# Patient Record
Sex: Female | Born: 1990 | Race: Black or African American | Hispanic: No | Marital: Single | Smoking: Never smoker
Health system: Southern US, Community
[De-identification: ages and names within clinical notes are randomized; demographics above are authoritative.]

## PROBLEM LIST (undated history)

## (undated) ENCOUNTER — Inpatient Hospital Stay: Payer: Self-pay

## (undated) DIAGNOSIS — A6000 Herpesviral infection of urogenital system, unspecified: Secondary | ICD-10-CM

## (undated) DIAGNOSIS — O99119 Other diseases of the blood and blood-forming organs and certain disorders involving the immune mechanism complicating pregnancy, unspecified trimester: Secondary | ICD-10-CM

## (undated) DIAGNOSIS — D649 Anemia, unspecified: Secondary | ICD-10-CM

## (undated) DIAGNOSIS — F32A Depression, unspecified: Secondary | ICD-10-CM

## (undated) DIAGNOSIS — D696 Thrombocytopenia, unspecified: Secondary | ICD-10-CM

## (undated) DIAGNOSIS — F329 Major depressive disorder, single episode, unspecified: Secondary | ICD-10-CM

## (undated) HISTORY — PX: APPENDECTOMY: SHX54

## (undated) HISTORY — DX: Herpesviral infection of urogenital system, unspecified: A60.00

---

## 2014-01-14 ENCOUNTER — Emergency Department: Payer: Self-pay | Admitting: Emergency Medicine

## 2014-01-14 LAB — URINALYSIS, COMPLETE
Bacteria: NONE SEEN
Bilirubin,UR: NEGATIVE
Blood: NEGATIVE
Glucose,UR: NEGATIVE mg/dL (ref 0–75)
Ketone: NEGATIVE
Nitrite: POSITIVE
PH: 7 (ref 4.5–8.0)
PROTEIN: NEGATIVE
Specific Gravity: 1.012 (ref 1.003–1.030)
Squamous Epithelial: 1
WBC UR: 18 /HPF (ref 0–5)

## 2014-01-14 LAB — COMPREHENSIVE METABOLIC PANEL
ALK PHOS: 79 U/L
ANION GAP: 9 (ref 7–16)
Albumin: 3.8 g/dL (ref 3.4–5.0)
BUN: 4 mg/dL — AB (ref 7–18)
Bilirubin,Total: 0.8 mg/dL (ref 0.2–1.0)
CALCIUM: 9 mg/dL (ref 8.5–10.1)
Chloride: 105 mmol/L (ref 98–107)
Co2: 23 mmol/L (ref 21–32)
Creatinine: 0.63 mg/dL (ref 0.60–1.30)
EGFR (African American): >60
GLUCOSE: 81 mg/dL (ref 65–99)
OSMOLALITY: 270 (ref 275–301)
Potassium: 3.4 mmol/L — ABNORMAL LOW (ref 3.5–5.1)
SGOT(AST): 25 U/L (ref 15–37)
SGPT (ALT): 38 U/L
SODIUM: 137 mmol/L (ref 136–145)
TOTAL PROTEIN: 7.8 g/dL (ref 6.4–8.2)

## 2014-01-14 LAB — CBC WITH DIFFERENTIAL/PLATELET
BASOS ABS: 0 10*3/uL (ref 0.0–0.1)
Basophil %: 0.6 %
EOS PCT: 0.4 %
Eosinophil #: 0 10*3/uL (ref 0.0–0.7)
HCT: 40 % (ref 35.0–47.0)
HGB: 13.7 g/dL (ref 12.0–16.0)
Lymphocyte #: 1.7 10*3/uL (ref 1.0–3.6)
Lymphocyte %: 27.5 %
MCH: 30.3 pg (ref 26.0–34.0)
MCHC: 34.3 g/dL (ref 32.0–36.0)
MCV: 88 fL (ref 80–100)
MONO ABS: 0.5 x10 3/mm (ref 0.2–0.9)
MONOS PCT: 7.8 %
Neutrophil #: 4 10*3/uL (ref 1.4–6.5)
Neutrophil %: 63.7 %
PLATELETS: 140 10*3/uL — AB (ref 150–440)
RBC: 4.53 10*6/uL (ref 3.80–5.20)
RDW: 12.3 % (ref 11.5–14.5)
WBC: 6.3 10*3/uL (ref 3.6–11.0)

## 2014-01-14 LAB — HCG, QUANTITATIVE, PREGNANCY: Beta Hcg, Quant.: 73441 m[IU]/mL — ABNORMAL HIGH

## 2014-03-07 LAB — OB RESULTS CONSOLE HIV ANTIBODY (ROUTINE TESTING)
HIV: NONREACTIVE
HIV: NONREACTIVE

## 2014-03-07 LAB — OB RESULTS CONSOLE RUBELLA ANTIBODY, IGM: Rubella: IMMUNE

## 2014-03-07 LAB — OB RESULTS CONSOLE GC/CHLAMYDIA
Chlamydia: POSITIVE
Chlamydia: POSITIVE
Gonorrhea: POSITIVE
Gonorrhea: POSITIVE

## 2014-03-07 LAB — OB RESULTS CONSOLE ABO/RH: RH Type: POSITIVE

## 2014-03-07 LAB — OB RESULTS CONSOLE HEPATITIS B SURFACE ANTIGEN: HEP B S AG: NEGATIVE

## 2014-03-07 LAB — OB RESULTS CONSOLE RPR: RPR: NONREACTIVE

## 2014-03-07 LAB — OB RESULTS CONSOLE VARICELLA ZOSTER ANTIBODY, IGG: Varicella: IMMUNE

## 2014-03-09 ENCOUNTER — Emergency Department: Payer: Self-pay | Admitting: Emergency Medicine

## 2014-05-02 LAB — OB RESULTS CONSOLE GC/CHLAMYDIA
Chlamydia: POSITIVE
Gonorrhea: NEGATIVE

## 2014-05-19 ENCOUNTER — Encounter: Payer: Self-pay | Admitting: Advanced Practice Midwife

## 2014-05-19 ENCOUNTER — Inpatient Hospital Stay
Admission: EM | Admit: 2014-05-19 | Discharge: 2014-05-20 | Disposition: A | Discharge status: Home / Self Care | Payer: Medicaid Other | Attending: Family Medicine | Admitting: Family Medicine

## 2014-05-19 DIAGNOSIS — R109 Unspecified abdominal pain: Secondary | ICD-10-CM

## 2014-05-19 DIAGNOSIS — R102 Pelvic and perineal pain: Secondary | ICD-10-CM | POA: Diagnosis present

## 2014-05-19 DIAGNOSIS — O26893 Other specified pregnancy related conditions, third trimester: Secondary | ICD-10-CM | POA: Diagnosis not present

## 2014-05-19 DIAGNOSIS — Z3A29 29 weeks gestation of pregnancy: Secondary | ICD-10-CM | POA: Insufficient documentation

## 2014-05-19 DIAGNOSIS — O09522 Supervision of elderly multigravida, second trimester: Secondary | ICD-10-CM

## 2014-05-19 NOTE — OB Triage Note (Signed)
Pt arrived via wheelchair from ED. Assigned to room Obs 4

## 2014-05-20 ENCOUNTER — Encounter: Payer: Self-pay | Admitting: Advanced Practice Midwife

## 2014-05-20 DIAGNOSIS — O09522 Supervision of elderly multigravida, second trimester: Secondary | ICD-10-CM

## 2014-05-20 DIAGNOSIS — O26893 Other specified pregnancy related conditions, third trimester: Secondary | ICD-10-CM | POA: Diagnosis not present

## 2014-05-20 DIAGNOSIS — R109 Unspecified abdominal pain: Secondary | ICD-10-CM

## 2014-05-20 LAB — URINALYSIS COMPLETE WITH MICROSCOPIC (ARMC ONLY)
Bacteria, UA: NONE SEEN
Bilirubin Urine: NEGATIVE
Glucose, UA: NEGATIVE mg/dL
HGB URINE DIPSTICK: NEGATIVE
Ketones, ur: NEGATIVE mg/dL
Leukocytes, UA: NEGATIVE
NITRITE: NEGATIVE
Protein, ur: NEGATIVE mg/dL
Specific Gravity, Urine: 1.016 (ref 1.005–1.030)
pH: 7 (ref 5.0–8.0)

## 2014-05-20 NOTE — H&P (Signed)
Obstetric H&P   Chief Complaint: Pelvic pressure  Prenatal Care Provider: [facility]  History of Present Illness: 24 y.o. G2P1001 at 1530w0d by 08/05/2014, by Last Menstrual Period presenting to L&D with pelvic pressure.  Has been present for the past month, wa  Review of Systems: 10 point review of systems negative unless otherwise noted in HPI  Past Medical History: HSV  Past Surgical History: Past Surgical History  Procedure Laterality Date  . Appendectomy      Pt reports appendectomy in 2006    Past Obstetric History:  Past Gynecologic History:  Family History: History reviewed. No pertinent family history.  Social History: History   Social History  . Marital Status: Single    Spouse Name: N/A  . Number of Children: N/A  . Years of Education: N/A   Occupational History  . Not on file.   Social History Main Topics  . Smoking status: Never Smoker   . Smokeless tobacco: Not on file  . Alcohol Use: No  . Drug Use: No  . Sexual Activity: Yes   Other Topics Concern  . Not on file   Social History Narrative    Medications: Prior to Admission medications   Not on File    Allergies: No Known Allergies  Physical Exam: Vitals: Blood pressure 109/58, pulse 89, temperature 98.6 F (37 C), temperature source Oral, resp. rate 18, height 5\' 5"  (1.651 m), weight 183 lb (83.008 kg), last menstrual period 10/29/2013.  Urine Dip Protein: n/a  FHT: 150, mod, + accels, no decels Toco:absent  General: NAD HEENT: normocephalic, anicteric Pulmonary: no increased work of breathing   Abdomen: Gravid,  Non-tender Genitourinary: deferred     Extremities: no edema  Labs: Results for orders placed or performed during the hospital encounter of 05/19/14 (from the past 24 hour(s))  Urinalysis complete, with microscopic [facility])     Status: Abnormal   Collection Time: 05/19/14 11:22 PM  Result Value Ref Range   Color, Urine YELLOW (A) YELLOW   APPearance CLEAR (A)  CLEAR   Glucose, UA NEGATIVE NEGATIVE mg/dL   Bilirubin Urine NEGATIVE NEGATIVE   Ketones, ur NEGATIVE NEGATIVE mg/dL   Specific Gravity, Urine 1.016 1.005 - 1.030   Hgb urine dipstick NEGATIVE NEGATIVE   pH 7.0 5.0 - 8.0   Protein, ur NEGATIVE NEGATIVE mg/dL   Nitrite NEGATIVE NEGATIVE   Leukocytes, UA NEGATIVE NEGATIVE   RBC / HPF 0-5 0 - 5 RBC/hpf   WBC, UA 0-5 0 - 5 WBC/hpf   Bacteria, UA NONE SEEN NONE SEEN   Squamous Epithelial / LPF 0-5 (A) NONE SEEN    Assessment: 24 y.o. G3P0000 4130w0d by 08/05/2014, by Last Menstrual Period presenting with discomforts of pregnancy  Plan: 1) No evidence of UTI contractions - discussed discomforts of pregnancy, pregnancy support belts.  2) Fetus - category I tracing  5) Disposition - discharge home follow up on 5/17

## 2014-07-23 ENCOUNTER — Observation Stay
Admission: EM | Admit: 2014-07-23 | Discharge: 2014-07-23 | Disposition: A | Discharge status: Home / Self Care | Payer: Medicaid Other | Attending: Certified Nurse Midwife | Admitting: Certified Nurse Midwife

## 2014-07-23 DIAGNOSIS — O26893 Other specified pregnancy related conditions, third trimester: Secondary | ICD-10-CM | POA: Diagnosis not present

## 2014-07-23 DIAGNOSIS — Z3A38 38 weeks gestation of pregnancy: Secondary | ICD-10-CM | POA: Insufficient documentation

## 2014-07-23 DIAGNOSIS — R109 Unspecified abdominal pain: Secondary | ICD-10-CM | POA: Insufficient documentation

## 2014-07-23 DIAGNOSIS — Z3490 Encounter for supervision of normal pregnancy, unspecified, unspecified trimester: Secondary | ICD-10-CM

## 2014-07-23 DIAGNOSIS — O26899 Other specified pregnancy related conditions, unspecified trimester: Secondary | ICD-10-CM

## 2014-07-23 LAB — URINALYSIS COMPLETE WITH MICROSCOPIC (ARMC ONLY)
Bilirubin Urine: NEGATIVE
Glucose, UA: NEGATIVE mg/dL
Hgb urine dipstick: NEGATIVE
KETONES UR: NEGATIVE mg/dL
Nitrite: NEGATIVE
PH: 7 (ref 5.0–8.0)
PROTEIN: NEGATIVE mg/dL
Specific Gravity, Urine: 1.013 (ref 1.005–1.030)

## 2014-07-23 LAB — OB RESULTS CONSOLE GBS: STREP GROUP B AG: NEGATIVE

## 2014-07-23 NOTE — OB Triage Note (Signed)
Pressure in lower abd since this am.  Denies ROM or VB.  + FM

## 2014-07-23 NOTE — Progress Notes (Signed)
Triage Note in L&D  24 year old G2 P1001 with EDC=08/06/2014 by LMP +20 week scan presented to L&D at 38 weeks with c/o lower abdominal pain x 2 days.  Has noticed this more today when sitting at a ball game. The pain comes and goes and feels better when she bends her knees.   [facility] at 18 weeks, a positive Chlamydia culture x2 and positive GC culture x 1 (TOC 5/25 was negative), a history of genital herpes, and thrombocytopenia (last plt count=113K).   Current Medications: Gummy PNV, Valtrex 1 Gm daily  Past OB Hx: SVD 2012 6#14oz female.  Past Surgical Hx: Appendectomy  Allergies: mushrooms, cherries  Exam: 97/54 Ht 5\' 5"  (1.651 m)  Wt 184 lb (83.462 kg)  BMI 30.62 kg/m2  LMP 10/29/2013 (LMP Unknown)   General: in NAD, alert, oriented Abdomen: tenderness over lower uterine segment, Cephalic presentation FHT: 135-140 with accels to 160s to 180s, mod variability Toco: occasional contraction Cervix: FT/30-40%/-2 Recent Results (from the past 2160 hour(s))  Urinalysis complete, with microscopic [facility])     Status: Abnormal   Collection Time: 05/19/14 11:22 PM  Result Value Ref Range   Color, Urine YELLOW (A) YELLOW   APPearance CLEAR (A) CLEAR   Glucose, UA NEGATIVE NEGATIVE mg/dL   Bilirubin Urine NEGATIVE NEGATIVE   Ketones, ur NEGATIVE NEGATIVE mg/dL   Specific Gravity, Urine 1.016 1.005 - 1.030   Hgb urine dipstick NEGATIVE NEGATIVE   pH 7.0 5.0 - 8.0   Protein, ur NEGATIVE NEGATIVE mg/dL   Nitrite NEGATIVE NEGATIVE   Leukocytes, UA NEGATIVE NEGATIVE   RBC / HPF 0-5 0 - 5 RBC/hpf   WBC, UA 0-5 0 - 5 WBC/hpf   Bacteria, UA NONE SEEN NONE SEEN   Squamous Epithelial / LPF 0-5 (A) NONE SEEN  Urinalysis complete, with microscopic ([facility] only)     Status: Abnormal   Collection Time: 07/23/14 12:50 AM  Result Value Ref Range   Color, Urine AMBER (A) YELLOW   APPearance CLEAR (A) CLEAR   Glucose, UA NEGATIVE NEGATIVE mg/dL   Bilirubin Urine  NEGATIVE NEGATIVE   Ketones, ur NEGATIVE NEGATIVE mg/dL   Specific Gravity, Urine 1.013 1.005 - 1.030   Hgb urine dipstick NEGATIVE NEGATIVE   pH 7.0 5.0 - 8.0   Protein, ur NEGATIVE NEGATIVE mg/dL   Nitrite NEGATIVE NEGATIVE   Leukocytes, UA TRACE (A) NEGATIVE   RBC / HPF 0-5 0 - 5 RBC/hpf   WBC, UA 0-5 0 - 5 WBC/hpf   Bacteria, UA RARE (A) NONE SEEN   Squamous Epithelial / LPF 0-5 (A) NONE SEEN   Mucous PRESENT   A: IUP at 38 weeks with lower abdominal pain probably due to round ligament and baby dropping into pelvis.  P: urine culture GBS done Comfort measures discussed: heat, warm bath, maternity support DC home [facility]  Farrel ConnersGUTIERREZ, COLLEEN, CNM

## 2014-07-23 NOTE — Progress Notes (Signed)
GBS culture obtained by C. Sharen HonesGutierrez, CNM and sent.

## 2014-07-23 NOTE — Progress Notes (Signed)
Pt given d/c inst and verbalized understanding.  She was then d/c home in stable condition.

## 2014-07-24 LAB — URINE CULTURE

## 2014-07-25 LAB — CULTURE, BETA STREP (GROUP B ONLY)

## 2014-08-13 ENCOUNTER — Inpatient Hospital Stay
Admission: EM | Admit: 2014-08-13 | Discharge: 2014-08-18 | DRG: 765 | Disposition: A | Discharge status: Home / Self Care | Payer: Medicaid Other | Attending: Obstetrics & Gynecology | Admitting: Obstetrics & Gynecology

## 2014-08-13 DIAGNOSIS — O9912 Other diseases of the blood and blood-forming organs and certain disorders involving the immune mechanism complicating childbirth: Secondary | ICD-10-CM | POA: Diagnosis present

## 2014-08-13 DIAGNOSIS — D6959 Other secondary thrombocytopenia: Secondary | ICD-10-CM | POA: Diagnosis present

## 2014-08-13 DIAGNOSIS — A6000 Herpesviral infection of urogenital system, unspecified: Secondary | ICD-10-CM | POA: Diagnosis present

## 2014-08-13 DIAGNOSIS — O480 Post-term pregnancy: Principal | ICD-10-CM | POA: Diagnosis present

## 2014-08-13 DIAGNOSIS — D696 Thrombocytopenia, unspecified: Secondary | ICD-10-CM | POA: Diagnosis present

## 2014-08-13 DIAGNOSIS — O0990 Supervision of high risk pregnancy, unspecified, unspecified trimester: Secondary | ICD-10-CM

## 2014-08-13 DIAGNOSIS — Z3A41 41 weeks gestation of pregnancy: Secondary | ICD-10-CM | POA: Diagnosis present

## 2014-08-13 DIAGNOSIS — Z3490 Encounter for supervision of normal pregnancy, unspecified, unspecified trimester: Secondary | ICD-10-CM

## 2014-08-13 DIAGNOSIS — O99214 Obesity complicating childbirth: Secondary | ICD-10-CM | POA: Diagnosis present

## 2014-08-13 DIAGNOSIS — Z6831 Body mass index (BMI) 31.0-31.9, adult: Secondary | ICD-10-CM

## 2014-08-13 DIAGNOSIS — O99119 Other diseases of the blood and blood-forming organs and certain disorders involving the immune mechanism complicating pregnancy, unspecified trimester: Secondary | ICD-10-CM

## 2014-08-13 LAB — CBC
HEMATOCRIT: 36.3 % (ref 35.0–47.0)
Hemoglobin: 12.2 g/dL (ref 12.0–16.0)
MCH: 28.9 pg (ref 26.0–34.0)
MCHC: 33.6 g/dL (ref 32.0–36.0)
MCV: 86.2 fL (ref 80.0–100.0)
PLATELETS: 82 10*3/uL — AB (ref 150–440)
RBC: 4.21 MIL/uL (ref 3.80–5.20)
RDW: 13.8 % (ref 11.5–14.5)
WBC: 10 10*3/uL (ref 3.6–11.0)

## 2014-08-13 LAB — COMPREHENSIVE METABOLIC PANEL
ALK PHOS: 231 U/L — AB (ref 38–126)
ALT: 15 U/L (ref 14–54)
AST: 23 U/L (ref 15–41)
Albumin: 3.1 g/dL — ABNORMAL LOW (ref 3.5–5.0)
Anion gap: 8 (ref 5–15)
BILIRUBIN TOTAL: 1.4 mg/dL — AB (ref 0.3–1.2)
BUN: 5 mg/dL — ABNORMAL LOW (ref 6–20)
CHLORIDE: 109 mmol/L (ref 101–111)
CO2: 19 mmol/L — ABNORMAL LOW (ref 22–32)
Calcium: 8.7 mg/dL — ABNORMAL LOW (ref 8.9–10.3)
Creatinine, Ser: 0.51 mg/dL (ref 0.44–1.00)
GFR calc non Af Amer: >60 mL/min (ref >60)
GLUCOSE: 101 mg/dL — AB (ref 65–99)
Potassium: 3.3 mmol/L — ABNORMAL LOW (ref 3.5–5.1)
Sodium: 136 mmol/L (ref 135–145)
TOTAL PROTEIN: 6.7 g/dL (ref 6.5–8.1)

## 2014-08-13 MED ORDER — FENTANYL 10 MCG/ML IV SOLN
INTRAVENOUS | Status: DC
  Filled 2014-08-13: qty 50

## 2014-08-13 MED ORDER — DINOPROSTONE 10 MG VA INST
10.0000 mg | VAGINAL_INSERT | Freq: Once | VAGINAL | Status: DC
  Filled 2014-08-13: qty 1

## 2014-08-13 MED ORDER — OXYCODONE-ACETAMINOPHEN 5-325 MG PO TABS: 1.0000 | ORAL_TABLET | ORAL | Status: DC | PRN

## 2014-08-13 MED ORDER — OXYCODONE-ACETAMINOPHEN 5-325 MG PO TABS: 2.0000 | ORAL_TABLET | ORAL | Status: DC | PRN

## 2014-08-13 MED ORDER — ONDANSETRON HCL 4 MG/2ML IJ SOLN
4.0000 mg | Freq: Four times a day (QID) | INTRAMUSCULAR | Status: DC | PRN
  Administered 2014-08-14: 8 mg via INTRAVENOUS

## 2014-08-13 MED ORDER — OXYTOCIN 40 UNITS IN LACTATED RINGERS INFUSION - SIMPLE MED
62.5000 mL/h | INTRAVENOUS | Status: DC
  Administered 2014-08-14: 40 mL via INTRAVENOUS
  Administered 2014-08-14: 200 mL via INTRAVENOUS

## 2014-08-13 MED ORDER — CITRIC ACID-SODIUM CITRATE 334-500 MG/5ML PO SOLN
30.0000 mL | ORAL | Status: DC | PRN
  Administered 2014-08-14: 30 mL via ORAL

## 2014-08-13 MED ORDER — LACTATED RINGERS IV SOLN
500.0000 mL | INTRAVENOUS | Status: DC | PRN
  Administered 2014-08-14: 20:00:00 via INTRAVENOUS

## 2014-08-13 MED ORDER — OXYTOCIN 40 UNITS IN LACTATED RINGERS INFUSION - SIMPLE MED
INTRAVENOUS | Status: AC
  Administered 2014-08-13: 2 m[IU]/min via INTRAVENOUS
  Filled 2014-08-13: qty 1000

## 2014-08-13 MED ORDER — ACETAMINOPHEN 325 MG PO TABS: 650.0000 mg | ORAL_TABLET | ORAL | Status: DC | PRN

## 2014-08-13 MED ORDER — SODIUM CHLORIDE 0.9 % IJ SOLN
INTRAMUSCULAR | Status: AC
  Administered 2014-08-13: 3 mL
  Filled 2014-08-13: qty 3

## 2014-08-13 MED ORDER — OXYTOCIN 40 UNITS IN LACTATED RINGERS INFUSION - SIMPLE MED
1.0000 m[IU]/min | INTRAVENOUS | Status: DC
  Administered 2014-08-13: 2 m[IU]/min via INTRAVENOUS
  Administered 2014-08-14: 18 m[IU]/min via INTRAVENOUS
  Administered 2014-08-14: 20 m[IU]/min via INTRAVENOUS

## 2014-08-13 MED ORDER — OXYTOCIN BOLUS FROM INFUSION: 500.0000 mL | INTRAVENOUS | Status: DC

## 2014-08-13 MED ORDER — TERBUTALINE SULFATE 1 MG/ML IJ SOLN: 0.2500 mg | Freq: Once | INTRAMUSCULAR | Status: AC | PRN

## 2014-08-13 MED ORDER — LACTATED RINGERS IV SOLN
INTRAVENOUS | Status: DC
  Administered 2014-08-13 – 2014-08-14 (×2): 125 mL/h via INTRAVENOUS

## 2014-08-13 MED ORDER — LIDOCAINE HCL (PF) 1 % IJ SOLN
30.0000 mL | INTRAMUSCULAR | Status: DC | PRN
  Filled 2014-08-13: qty 30

## 2014-08-13 NOTE — H&P (Signed)
OB History & Physical   History of Present Illness:  Chief Complaint:   HPI:  Marissa Andrews is a 24 y.o. G2P1001 female at [redacted]w[redacted]d dated by LMP c/w 20w [place] with EDC of 08/06/14 who presents to L&D for IOL due to past due date. Her pregnancy has been complicated by: 1. Late to care 2. History of genital HSV, on valtrex since 36w 3. Obesity, BMI 31 4. Thrombocytopenia - highest 131, latest: 7/27 = 94k 5. +GC/CT on NOB, on TOC still +CT, last TOC was 5/25, neg for both  +FM, occasional CTX, no LOF, no VB  Maternal Medical History:  History reviewed. No pertinent past medical history.  Past Surgical History  Procedure Laterality Date  . Appendectomy      Pt reports appendectomy in 2006    No Known Allergies  Prior to Admission medications   Medication Sig Start Date End Date Taking? Authorizing Provider  valACYclovir (VALTREX) 500 MG tablet Take 500 mg by mouth daily.    Historical Provider, MD     Prenatal care site: [facility]  Social History: She  reports that she has never smoked. She does not have any smokeless tobacco history on file. She reports that she does not drink alcohol or use illicit drugs.  Family History: family history is not on file.   Review of Systems: Negative x 10 systems reviewed except as noted in the HPI.     Physical Exam:  Vital Signs: LMP 10/30/2013 General: no acute distress.  HEENT: normocephalic, atraumatic Heart: regular rate & rhythm.  No murmurs/rubs/gallops Lungs: clear to auscultation bilaterally, normal respiratory effort Abdomen: soft, gravid, non-tender;  EFW: 7.15 Pelvic:   External: Normal external female genitalia, no evidence of lesions on speculum exam. GC/CT collected.  Cervix: 3-4/60/-1, soft, anterior; Bishop = 10   Extremities: non-tender, symmetric, no edema bilaterally.  DTRs: 2+ Neurologic: Alert & oriented x 3.    Pertinent Results:  Prenatal Labs: Blood type/Rh B+  Antibody screen neg   Rubella varicella Immune immune  RPR NR  HBsAg neg  HIV neg  GC pos  Chlamydia pos  Genetic screening declined  1 hour GTT 80  3 hour GTT neg  GBS neg  Platelets: 7/27 = 94   Assessment:  Marissa Andrews is a 24 y.o. G2P1001 female at [redacted]w[redacted]d with IOL for past due date  Plan:  1. Admit to Labor & Delivery  2. CBC, T&S, Clrs, IVF,  3. GBS neg   4. Consents obtained. 5. Notify anesthesia of platelets if >100, may want to insert epidural now.  If <100, per anesthesia discretion. If unable to receive epidural, control with IV meds and consider Fentanyl PCA. 6. Oxytocin induction since bishop is >8.   7. Continuous toco/efm  ----- Ranae Plumber, MD Attending Obstetrician and Gynecologist [facility]

## 2014-08-14 ENCOUNTER — Inpatient Hospital Stay: Payer: Medicaid Other | Admitting: Anesthesiology

## 2014-08-14 ENCOUNTER — Encounter
Admission: EM | Disposition: A | Discharge status: Home / Self Care | Payer: Self-pay | Source: Home / Self Care | Attending: Obstetrics & Gynecology

## 2014-08-14 LAB — CBC
HCT: 33.4 % — ABNORMAL LOW (ref 35.0–47.0)
Hemoglobin: 10.9 g/dL — ABNORMAL LOW (ref 12.0–16.0)
MCH: 28.7 pg (ref 26.0–34.0)
MCHC: 32.6 g/dL (ref 32.0–36.0)
MCV: 88.2 fL (ref 80.0–100.0)
Platelets: 86 10*3/uL — ABNORMAL LOW (ref 150–440)
RBC: 3.79 MIL/uL — ABNORMAL LOW (ref 3.80–5.20)
RDW: 13.9 % (ref 11.5–14.5)
WBC: 19.4 10*3/uL — ABNORMAL HIGH (ref 3.6–11.0)

## 2014-08-14 LAB — PLATELET COUNT: Platelets: 66 10*3/uL — ABNORMAL LOW (ref 150–400)

## 2014-08-14 LAB — FIBRINOGEN: Fibrinogen: 463 mg/dL (ref 210–470)

## 2014-08-14 LAB — CHLAMYDIA/NGC RT PCR (ARMC ONLY)
Chlamydia Tr: NOT DETECTED
N gonorrhoeae: NOT DETECTED

## 2014-08-14 LAB — APTT: aPTT: 29 seconds (ref 24–36)

## 2014-08-14 LAB — PROTIME-INR
INR: 1.35
PROTHROMBIN TIME: 16.9 s — AB (ref 11.4–15.0)

## 2014-08-14 LAB — ABO/RH: ABO/RH(D): B POS

## 2014-08-14 SURGERY — Surgical Case
Anesthesia: General

## 2014-08-14 MED ORDER — BUTORPHANOL TARTRATE 1 MG/ML IJ SOLN
INTRAMUSCULAR | Status: AC
  Administered 2014-08-14: 2 mg via INTRAVENOUS
  Filled 2014-08-14: qty 2

## 2014-08-14 MED ORDER — TERBUTALINE SULFATE 1 MG/ML IJ SOLN
INTRAMUSCULAR | Status: AC
  Administered 2014-08-14: 0.25 mg via SUBCUTANEOUS
  Filled 2014-08-14: qty 1

## 2014-08-14 MED ORDER — SIMETHICONE 80 MG PO CHEW
80.0000 mg | CHEWABLE_TABLET | ORAL | Status: DC
  Administered 2014-08-15 – 2014-08-17 (×2): 80 mg via ORAL
  Filled 2014-08-14: qty 1

## 2014-08-14 MED ORDER — DIPHENHYDRAMINE HCL 25 MG PO CAPS
25.0000 mg | ORAL_CAPSULE | Freq: Four times a day (QID) | ORAL | Status: DC | PRN
  Administered 2014-08-16: 25 mg via ORAL
  Filled 2014-08-14: qty 1

## 2014-08-14 MED ORDER — PRENATAL MULTIVITAMIN CH
1.0000 | ORAL_TABLET | Freq: Every day | ORAL | Status: DC
  Administered 2014-08-15 – 2014-08-18 (×4): 1 via ORAL
  Filled 2014-08-14 (×3): qty 1

## 2014-08-14 MED ORDER — CITRIC ACID-SODIUM CITRATE 334-500 MG/5ML PO SOLN
ORAL | Status: AC
  Administered 2014-08-14: 30 mL via ORAL
  Filled 2014-08-14: qty 15

## 2014-08-14 MED ORDER — LANOLIN HYDROUS EX OINT: 1.0000 "application " | TOPICAL_OINTMENT | CUTANEOUS | Status: DC | PRN

## 2014-08-14 MED ORDER — SODIUM CHLORIDE 0.9 % IJ SOLN: 9.0000 mL | INTRAMUSCULAR | Status: DC | PRN

## 2014-08-14 MED ORDER — ONDANSETRON HCL 4 MG/2ML IJ SOLN: 4.0000 mg | Freq: Once | INTRAMUSCULAR | Status: DC | PRN

## 2014-08-14 MED ORDER — MENTHOL 3 MG MT LOZG
1.0000 | LOZENGE | OROMUCOSAL | Status: DC | PRN
  Filled 2014-08-14: qty 9

## 2014-08-14 MED ORDER — OXYTOCIN 40 UNITS IN LACTATED RINGERS INFUSION - SIMPLE MED
62.5000 mL/h | INTRAVENOUS | Status: AC
  Administered 2014-08-15: 62.5 mL/h via INTRAVENOUS
  Filled 2014-08-14: qty 1000

## 2014-08-14 MED ORDER — BUPIVACAINE 0.25 % ON-Q PUMP DUAL CATH 400 ML
400.0000 mL | INJECTION | Status: DC
  Filled 2014-08-14: qty 400

## 2014-08-14 MED ORDER — MORPHINE SULFATE 1 MG/ML IV SOLN
INTRAVENOUS | Status: DC
  Administered 2014-08-15 – 2014-08-16 (×3): via INTRAVENOUS
  Filled 2014-08-14 (×3): qty 25

## 2014-08-14 MED ORDER — BUTORPHANOL TARTRATE 1 MG/ML IJ SOLN: 1.0000 mg | Freq: Once | INTRAMUSCULAR | Status: DC

## 2014-08-14 MED ORDER — BUTORPHANOL TARTRATE 1 MG/ML IJ SOLN
1.0000 mg | Freq: Once | INTRAMUSCULAR | Status: AC
  Administered 2014-08-14: 1 mg via INTRAVENOUS
  Filled 2014-08-14: qty 1

## 2014-08-14 MED ORDER — BUTORPHANOL TARTRATE 1 MG/ML IJ SOLN
2.0000 mg | Freq: Once | INTRAMUSCULAR | Status: AC
  Administered 2014-08-14: 1 mg via INTRAVENOUS
  Administered 2014-08-14: 2 mg via INTRAVENOUS

## 2014-08-14 MED ORDER — FENTANYL CITRATE (PF) 100 MCG/2ML IJ SOLN
50.0000 ug | Freq: Once | INTRAMUSCULAR | Status: AC
  Administered 2014-08-14: 50 ug via INTRAVENOUS

## 2014-08-14 MED ORDER — SIMETHICONE 80 MG PO CHEW
80.0000 mg | CHEWABLE_TABLET | Freq: Three times a day (TID) | ORAL | Status: DC
  Administered 2014-08-15 – 2014-08-18 (×8): 80 mg via ORAL
  Filled 2014-08-14 (×9): qty 1

## 2014-08-14 MED ORDER — ONDANSETRON HCL 4 MG/2ML IJ SOLN: 4.0000 mg | Freq: Four times a day (QID) | INTRAMUSCULAR | Status: DC | PRN

## 2014-08-14 MED ORDER — DIBUCAINE 1 % RE OINT: 1.0000 "application " | TOPICAL_OINTMENT | RECTAL | Status: DC | PRN

## 2014-08-14 MED ORDER — OXYTOCIN 10 UNIT/ML IJ SOLN
INTRAMUSCULAR | Status: AC
  Filled 2014-08-14: qty 2

## 2014-08-14 MED ORDER — CEFAZOLIN SODIUM-DEXTROSE 2-3 GM-% IV SOLR
INTRAVENOUS | Status: AC
Start: 2014-08-14 — End: 2014-08-15
  Filled 2014-08-14: qty 50

## 2014-08-14 MED ORDER — ACETAMINOPHEN 325 MG PO TABS: 650.0000 mg | ORAL_TABLET | ORAL | Status: DC | PRN

## 2014-08-14 MED ORDER — SODIUM CHLORIDE 0.9 % IJ SOLN
INTRAMUSCULAR | Status: AC
  Filled 2014-08-14: qty 3

## 2014-08-14 MED ORDER — LIDOCAINE HCL (PF) 1 % IJ SOLN
INTRAMUSCULAR | Status: AC
  Filled 2014-08-14: qty 30

## 2014-08-14 MED ORDER — FENTANYL CITRATE (PF) 100 MCG/2ML IJ SOLN
INTRAMUSCULAR | Status: AC
  Administered 2014-08-14: 50 ug via INTRAVENOUS
  Filled 2014-08-14: qty 2

## 2014-08-14 MED ORDER — AMMONIA AROMATIC IN INHA
RESPIRATORY_TRACT | Status: AC
  Filled 2014-08-14: qty 10

## 2014-08-14 MED ORDER — WITCH HAZEL-GLYCERIN EX PADS: 1.0000 "application " | MEDICATED_PAD | CUTANEOUS | Status: DC | PRN

## 2014-08-14 MED ORDER — BUPIVACAINE HCL 0.5 % IJ SOLN
10.0000 mL | Freq: Once | INTRAMUSCULAR | Status: DC
  Filled 2014-08-14: qty 10

## 2014-08-14 MED ORDER — DIPHENHYDRAMINE HCL 50 MG/ML IJ SOLN: 12.5000 mg | Freq: Four times a day (QID) | INTRAMUSCULAR | Status: DC | PRN

## 2014-08-14 MED ORDER — FENTANYL 10 MCG/ML IV SOLN
INTRAVENOUS | Status: DC
  Administered 2014-08-14: 17:00:00 via INTRAVENOUS
  Filled 2014-08-14: qty 50

## 2014-08-14 MED ORDER — PROPOFOL 10 MG/ML IV BOLUS
INTRAVENOUS | Status: DC | PRN
  Administered 2014-08-14: 160 mg via INTRAVENOUS

## 2014-08-14 MED ORDER — HYDROMORPHONE HCL 1 MG/ML IJ SOLN: 0.2500 mg | INTRAMUSCULAR | Status: DC | PRN

## 2014-08-14 MED ORDER — MISOPROSTOL 200 MCG PO TABS
ORAL_TABLET | ORAL | Status: AC
  Filled 2014-08-14: qty 4

## 2014-08-14 MED ORDER — NALOXONE HCL 0.4 MG/ML IJ SOLN: 0.4000 mg | INTRAMUSCULAR | Status: DC | PRN

## 2014-08-14 MED ORDER — FENTANYL CITRATE (PF) 100 MCG/2ML IJ SOLN
INTRAMUSCULAR | Status: DC | PRN
Start: 2014-08-14 — End: 2014-08-14
  Administered 2014-08-14: 100 ug via INTRAVENOUS
  Administered 2014-08-14: 50 ug via INTRAVENOUS
  Administered 2014-08-14: 100 ug via INTRAVENOUS
  Administered 2014-08-14 (×5): 50 ug via INTRAVENOUS

## 2014-08-14 MED ORDER — TERBUTALINE SULFATE 1 MG/ML IJ SOLN
0.2500 mg | Freq: Once | INTRAMUSCULAR | Status: AC
  Administered 2014-08-14: 0.25 mg via SUBCUTANEOUS

## 2014-08-14 MED ORDER — BUTORPHANOL TARTRATE 1 MG/ML IJ SOLN
INTRAMUSCULAR | Status: AC
  Administered 2014-08-14: 1 mg via INTRAVENOUS
  Filled 2014-08-14: qty 1

## 2014-08-14 MED ORDER — SIMETHICONE 80 MG PO CHEW
80.0000 mg | CHEWABLE_TABLET | ORAL | Status: DC | PRN
  Filled 2014-08-14: qty 1

## 2014-08-14 MED ORDER — SUCCINYLCHOLINE CHLORIDE 20 MG/ML IJ SOLN
INTRAMUSCULAR | Status: DC | PRN
Start: 2014-08-14 — End: 2014-08-14
  Administered 2014-08-14: 100 mg via INTRAVENOUS

## 2014-08-14 MED ORDER — SENNOSIDES-DOCUSATE SODIUM 8.6-50 MG PO TABS
2.0000 | ORAL_TABLET | ORAL | Status: DC
  Administered 2014-08-15: 2 via ORAL
  Filled 2014-08-14 (×2): qty 2

## 2014-08-14 MED ORDER — FENTANYL CITRATE (PF) 100 MCG/2ML IJ SOLN
25.0000 ug | INTRAMUSCULAR | Status: DC | PRN
  Administered 2014-08-14 (×2): 25 ug via INTRAVENOUS

## 2014-08-14 MED ORDER — CEFAZOLIN SODIUM-DEXTROSE 2-3 GM-% IV SOLR
2.0000 g | INTRAVENOUS | Status: AC
  Administered 2014-08-14: 2 g via INTRAVENOUS

## 2014-08-14 MED ORDER — BUPIVACAINE HCL (PF) 0.5 % IJ SOLN
INTRAMUSCULAR | Status: AC
  Filled 2014-08-14: qty 30

## 2014-08-14 MED ORDER — DIPHENHYDRAMINE HCL 12.5 MG/5ML PO ELIX
12.5000 mg | ORAL_SOLUTION | Freq: Four times a day (QID) | ORAL | Status: DC | PRN
  Filled 2014-08-14: qty 5

## 2014-08-14 MED ORDER — SODIUM CHLORIDE 0.9 % IV SOLN: Freq: Once | INTRAVENOUS | Status: DC

## 2014-08-14 MED ORDER — LACTATED RINGERS IV SOLN
INTRAVENOUS | Status: DC
Start: 2014-08-14 — End: 2014-08-17
  Administered 2014-08-15 – 2014-08-16 (×2): via INTRAVENOUS

## 2014-08-14 SURGICAL SUPPLY — 26 items
BAG COUNTER SPONGE EZ (MISCELLANEOUS) ×2 IMPLANT
CANISTER SUCT 3000ML (MISCELLANEOUS) ×3 IMPLANT
CATH KIT ON-Q SILVERSOAK 5IN (CATHETERS) IMPLANT
CHLORAPREP W/TINT 26ML (MISCELLANEOUS) ×6 IMPLANT
CLOSURE WOUND 1/2 X4 (GAUZE/BANDAGES/DRESSINGS) ×1
COUNTER SPONGE BAG EZ (MISCELLANEOUS) ×1
DRSG TELFA 3X8 NADH (GAUZE/BANDAGES/DRESSINGS) ×3 IMPLANT
ELECT CAUTERY BLADE 6.4 (BLADE) ×3 IMPLANT
GAUZE SPONGE 4X4 12PLY STRL (GAUZE/BANDAGES/DRESSINGS) ×3 IMPLANT
GLOVE BIO SURGEON STRL SZ7 (GLOVE) ×3 IMPLANT
GLOVE INDICATOR 7.5 STRL GRN (GLOVE) ×12 IMPLANT
GOWN STRL REUS W/ TWL LRG LVL3 (GOWN DISPOSABLE) ×3 IMPLANT
GOWN STRL REUS W/TWL LRG LVL3 (GOWN DISPOSABLE) ×6
LIQUID BAND (GAUZE/BANDAGES/DRESSINGS) ×3 IMPLANT
NS IRRIG 1000ML POUR BTL (IV SOLUTION) ×3 IMPLANT
PACK C SECTION AR (MISCELLANEOUS) ×3 IMPLANT
PAD GROUND ADULT SPLIT (MISCELLANEOUS) ×3 IMPLANT
PAD OB MATERNITY 4.3X12.25 (PERSONAL CARE ITEMS) ×3 IMPLANT
PAD PREP 24X41 OB/GYN DISP (PERSONAL CARE ITEMS) ×3 IMPLANT
SPONGE LAP 18X18 5 PK (GAUZE/BANDAGES/DRESSINGS) ×3 IMPLANT
STRIP CLOSURE SKIN 1/2X4 (GAUZE/BANDAGES/DRESSINGS) ×2 IMPLANT
SUT MNCRL AB 4-0 PS2 18 (SUTURE) IMPLANT
SUT PDS AB 1 TP1 96 (SUTURE) ×3 IMPLANT
SUT VIC AB 0 CTX 36 (SUTURE) ×4
SUT VIC AB 0 CTX36XBRD ANBCTRL (SUTURE) ×2 IMPLANT
SUT VIC AB 2-0 CT1 36 (SUTURE) ×3 IMPLANT

## 2014-08-14 NOTE — Progress Notes (Signed)
Labs reviewed with stable platelets, fibrinogen, and coags.  Appropriate drop in H&H for amount of EBL.

## 2014-08-14 NOTE — Anesthesia Procedure Notes (Signed)
Procedure Name: Intubation Date/Time: 08/14/2014 8:25 PM Performed by: Junious Silk Pre-anesthesia Checklist: Patient identified, Patient being monitored, Timeout performed, Emergency Drugs available and Suction available Patient Re-evaluated:Patient Re-evaluated prior to inductionOxygen Delivery Method: Circle system utilized Preoxygenation: Pre-oxygenation with 100% oxygen Intubation Type: IV induction and Rapid sequence Ventilation: Mask ventilation without difficulty Laryngoscope Size: Mac and 3 Grade View: Grade I Tube type: Oral Tube size: 7.0 mm Number of attempts: 1 Airway Equipment and Method: Stylet Placement Confirmation: ETT inserted through vocal cords under direct vision,  positive ETCO2 and breath sounds checked- equal and bilateral Secured at: 21 cm Tube secured with: Tape Dental Injury: Teeth and Oropharynx as per pre-operative assessment

## 2014-08-14 NOTE — Progress Notes (Signed)
Intrapartum progress note:  Patient rested well overnight, no complaints, feeling contractions, s/p Stadol x1 (did not like)  BP 98/37 mmHg  Pulse 87  Temp(Src) 98 F (36.7 C) (Oral)  Resp 18  SVE: 4/60/-2 FHT: 120 mod, no accels no decels Toco: q97min, pitocin @ 16  A/P: 24yo G2P1001 @ 41.0 with IOL for past due date 1. IUP: category 1 tracing, with decreased activity due to stadol effect.  No evidence of fetal acidemia/hypoxemia 2. SVE: disagree with nursing assessment from 05:30, fetal head to high for safe AROM at this time.  If needed for monitoring. 3. Continue toco/efm, active management of IOL with pitocin  ----- Ranae Plumber, MD Attending Obstetrician and Gynecologist [facility]

## 2014-08-14 NOTE — OR Nursing (Signed)
Bakri balloon was placed by Dr. Bonney Aid 

## 2014-08-14 NOTE — OR Nursing (Signed)
Patient on arrival to pacu, meds given by crna Junious Silk and patient's fundus firm.  Uterine balloon draining dark red blood, small to moderate amount

## 2014-08-14 NOTE — Transfer of Care (Signed)
Immediate Anesthesia Transfer of Care Note  Patient: Marissa Andrews  Procedure(s) Performed: Procedure(s): CESAREAN SECTION (N/A)  Patient Location: PACU  Anesthesia Type:General  Level of Consciousness: sedated  Airway & Oxygen Therapy: Patient Spontanous Breathing and Patient connected to face mask oxygen  Post-op Assessment: Report given to RN and Post -op Vital signs reviewed and stable  Post vital signs: Reviewed and stable  Last Vitals:  Filed Vitals:   08/14/14 2122  BP: 101/48  Pulse: 132  Temp: 36.6 C  Resp: 16    Complications: No apparent anesthesia complications

## 2014-08-14 NOTE — Progress Notes (Signed)
Subjective:  Worsening pain with contractions  Objective:   Vitals: Blood pressure 102/79, pulse 113, temperature 98.5 F (36.9 C), temperature source Oral, resp. rate 18, height  (1.651 m), weight 89.359 kg (197 lb), last menstrual period 10/30/2013, unknown if currently breastfeeding. General:  Abdomen: Cervical Exam:  Dilation: 5.5 Effacement (%): 70 Cervical Position: Posterior Station: -2 Presentation: Vertex Exam by:: jac, rn  FHT: 120, moderate, no accels, no decels (non-contiguous tracing) Toco:q55min  Results for orders placed or performed during the hospital encounter of 08/13/14 (from the past 24 hour(s))  CBC     Status: Abnormal   Collection Time: 08/13/14  9:15 PM  Result Value Ref Range   WBC 10.0 3.6 - 11.0 K/uL   RBC 4.21 3.80 - 5.20 MIL/uL   Hemoglobin 12.2 12.0 - 16.0 g/dL   HCT 16.1 09.6 - 04.5 %   MCV 86.2 80.0 - 100.0 fL   MCH 28.9 26.0 - 34.0 pg   MCHC 33.6 32.0 - 36.0 g/dL   RDW 40.9 81.1 - 91.4 %   Platelets 82 (L) 150 - 440 K/uL  Type and screen     Status: None   Collection Time: 08/13/14  9:15 PM  Result Value Ref Range   ABO/RH(D) B POS    Antibody Screen NEG    Sample Expiration 08/16/2014   ABO/Rh     Status: None   Collection Time: 08/13/14  9:15 PM  Result Value Ref Range   ABO/RH(D) B POS   Comprehensive metabolic panel     Status: Abnormal   Collection Time: 08/13/14  9:15 PM  Result Value Ref Range   Sodium 136 135 - 145 mmol/L   Potassium 3.3 (L) 3.5 - 5.1 mmol/L   Chloride 109 101 - 111 mmol/L   CO2 19 (L) 22 - 32 mmol/L   Glucose, Bld 101 (H) 65 - 99 mg/dL   BUN 5 (L) 6 - 20 mg/dL   Creatinine, Ser 7.82 0.44 - 1.00 mg/dL   Calcium 8.7 (L) 8.9 - 10.3 mg/dL   Total Protein 6.7 6.5 - 8.1 g/dL   Albumin 3.1 (L) 3.5 - 5.0 g/dL   AST 23 15 - 41 U/L   ALT 15 14 - 54 U/L   Alkaline Phosphatase 231 (H) 38 - 126 U/L   Total Bilirubin 1.4 (H) 0.3 - 1.2 mg/dL   GFR calc non Af Amer >60 >60 mL/min   GFR calc Af Amer >60 >60  mL/min   Anion gap 8 5 - 15  Chlamydia/NGC rt PCR ([facility] only)     Status: None   Collection Time: 08/13/14  9:16 PM  Result Value Ref Range   Specimen source GC/Chlam URINE, CLEAN CATCH    Chlamydia Tr NOT DETECTED NOT DETECTED   N gonorrhoeae NOT DETECTED NOT DETECTED    Assessment:   24 y.o. G2P1001 [redacted]w[redacted]d failed IOL with active phase arrest at 5.5cm  Plan:   1) Labor - no cervical change over the last 12-hrs despite adequate MVU.  Patient requesting to proceed with C-section  2) Fetus - cat II tracing  3) Thrombocytopenia - platelets 82K, repeating now and 1 unit of platelets ordered

## 2014-08-14 NOTE — Progress Notes (Signed)
Pt has been resting all night with her sister at bedside. Encouraged to sleep, she states she really doesn't need to at this time

## 2014-08-14 NOTE — Progress Notes (Signed)
L&D Note  08/14/2014 - 1:17 PM  24 y.o. [id number] [redacted]w[redacted]d   Ms. Antonietta Rutha Melgoza is admitted for IOL for postdates   Subjective:  Requesting cervical exam, reports contractions are not as strong as previously  Objective:   Filed Vitals:   08/14/14 0947 08/14/14 1033 08/14/14 1142 08/14/14 1143  BP: 109/75 116/70 119/60 119/60  Pulse: 89 81 76 76  Temp:   98.5 F (36.9 C)   TempSrc:   Oral   Resp:      Height:      Weight:        Current Vital Signs 24h Vital Sign Ranges  T 98.5 F (36.9 C) Temp  Avg: 98.1 F (36.7 C)  Min: 97.8 F (36.6 C)  Max: 98.5 F (36.9 C)  BP 119/60 mmHg BP  Min: 88/40  Max: 133/60  HR 76 Pulse  Avg: 82  Min: 66  Max: 93  RR 18 Resp  Avg: 18  Min: 18  Max: 18  SaO2     No Data Recorded       24 Hour I/O Current Shift I/O  Time Ins Outs        FHR: category 1 tracing, baseline 140, mod variability, + accels, occasional quick variable decels Toco: q 2-3 min SVE: 5/60/-2   Assessment :  IUP at 41 wks, IOL for postdates    Plan:  AROM with clear fluid obtained Discussed previously established POM for Fentanyl PCA prn for pain control d/t thrombocytopenia  Marta Antu, [place]

## 2014-08-14 NOTE — Anesthesia Postprocedure Evaluation (Signed)
  Anesthesia Post-op Note  Patient: Marissa Andrews  Procedure(s) Performed: Procedure(s): CESAREAN SECTION (N/A)  Anesthesia type:General ETT  Patient location: PACU  Post pain: Pain level controlled  Post assessment: Post-op Vital signs reviewed, Patient's Cardiovascular Status Stable, Respiratory Function Stable, Patent Airway and No signs of Nausea or vomiting  Post vital signs: Reviewed and stable  Last Vitals:  Filed Vitals:   08/14/14 2313  BP: 104/62  Pulse: 99  Temp: 36.8 C  Resp: 18    Level of consciousness: awake, alert  and patient cooperative  Complications: No apparent anesthesia complications

## 2014-08-14 NOTE — Op Note (Signed)
Preoperative Diagnosis: 1) 24 y.o. G2P2001 at [redacted]w[redacted]d 2) Active phase arrest 3) Gestational thrombocytopenia  Postoperative Diagnosis: 1) 24 y.o. G2P2001 at [redacted]w[redacted]d 2) Active phase arrest 3) Gestational thrombocytopenia 4) Postpartum hemorrhage with Bakri balloon placement  Operation Performed: Primary low transverse C-section via pfannenstiel skin incision, Bakri ballon placement  Indication: Active phase arrest at 5cm, gestational thrombocytopenia with dropping blood pressures.    Anesthesia: General  Primary Surgeon: Vena Austria, MD  Assistant: Laurell Roof CNM  Preoperative Antibiotics: 2g ancef  Estimated Blood Loss: * No blood loss amount entered *  IV Fluids:  Urine Output:: with of clot expressed at the end of the procedure  Drains or Tubes: Foley to gravity drainage, bakri balloon  Implants: none  Specimens Removed: none  Complications: none  Intraoperative Findings:  Normal tubes ovaries and uterus.  Delivery resulted in the birth of a liveborn girl, APGAR (1 MIN): 8   APGAR (5 MINS): 9   APGAR (10 MINS):  , weight  pending  Patient Condition: stable  Procedure in Detail:    She was positioned in the supine position, prepped and draped in the  Usual sterile fashion.  Prior to proceeding with the case a time out was performed and general anesthesia was induced.  Utilizing the scalpel a pfannenstiel skin incision was made 2cm above the pubic symphysis and carried down sharply to the the level of the rectus fascia.  The fascia was incised in the midline using the scalpel and then extended using mayo scissors.  The superior border of the rectus fascia was grasped with two Kocher clamps and the underlying rectus muscles were dissected of the fascia using blunt dissection.  The median raphae was incised using Mayo scissors.   The inferior border of the rectus fascia was dissected of the rectus muscles in a similar fashion.  The midline was  identified, the peritoneum was entered bluntly and expanded using manual tractions.  The uterus was noted to be in a none rotated position.  Next the bladder blade was placed retracting the bladder caudally.  A bladder flap was not created.  A low transverse incision was scored on the lower uterine segment.  The hysterotomy was entered bluntly using the operators finger.  The hysterotomy incision was extended using manual traction.  The operators hand was placed within the hysterotomy position noting the fetus to be within the OA position.  The vertex was grasped, flexed, brought to the incision, and delivered a traumatically using fundal pressure.  The remainder of the body delivered with ease.  The infant was suctioned, cord was clamped and cut before handing off to the awaiting neonatologist.  The placenta was delivered using manual extraction.  The uterus was exteriorized, wiped clean of clots and debris using two moist laps.  The hysterotomy was closed using a two layer closure of 0 Vicryl, with the first being a running locked, the second a vertical imbricating.  The uterus was returned to the abdomen.  The peritoneal gutters were wiped clean of clots and debris using two moist laps.  The hysterotomy incision was re-inspected and required 1 figure of eight at the right aspect after which it was noted to be hemostatic.  The rectus muscles were inspected noted to be hemostatic.  TThe fascia was closed using a looped #1 PDS in a running fashion taking 1cm by 1cm bites.  The subcutaneous tissue was irrigated using warm saline, hemostasis achieved using the bovie.  The subcutaneous dead space  was less than 3cm and was not closed.  The skin was closed using  staples  Sponge needle and instrument counts were corrects times two.  The patient tolerated the procedure well and was taken to the recovery room in stable condition.  At this point fundal massage was performed evacuating approximately of clot.  Decision  was made to place bakri balloon which was inflated to using sterile water.

## 2014-08-14 NOTE — OR Nursing (Signed)
No ON-Q pump placed. Surgeon changed his mind about using it.

## 2014-08-14 NOTE — Progress Notes (Signed)
L&D Note  08/14/2014 - 6:05 PM  24 y.o. Z6X0960 [redacted]w[redacted]d   [name] is admitted for IOL for postdates   Subjective:  Pt requesting other pain medication as Fentanyl PCA does not seem to be working  Objective:   Filed Vitals:   08/14/14 1639 08/14/14 1641 08/14/14 1643 08/14/14 1727  BP: 106/30 72/43 104/54 102/79  Pulse: 85 73 76 113  Temp:      TempSrc:      Resp:      Height:      Weight:        Current Vital Signs 24h Vital Sign Ranges  T 98.5 F (36.9 C) Temp  Avg: 98.2 F (36.8 C)  Min: 97.8 F (36.6 C)  Max: 98.5 F (36.9 C)  BP 102/79 mmHg BP  Min: 72/43  Max: 133/60  HR (!) 113 Pulse  Avg: 81.6  Min: 66  Max: 113  RR 18 Resp  Avg: 18  Min: 18  Max: 18  SaO2   Not Delivered No Data Recorded       24 Hour I/O Current Shift I/O  Time Ins Outs        FHR: baseline 120, min variability Toco: regular SVE: 5/80/-2   Assessment :  IUP at 41 wks, IOL for postdates    Plan:  Pain management - Fentanyl PCA d/c, will give Stadol IV instead  IUPC placed, will titrate for adequate MVUs   Marta Antu, [place]

## 2014-08-14 NOTE — Anesthesia Preprocedure Evaluation (Signed)
Anesthesia Evaluation  Patient identified by MRN, date of birth, ID band Patient awake    Reviewed: Allergy & Precautions, H&P , NPO status , Patient's Chart, lab work & pertinent test results, reviewed documented beta blocker date and time   Airway Mallampati: II  TM Distance: >3 FB Neck ROM: full    Dental no notable dental hx. (+) Teeth Intact   Pulmonary neg pulmonary ROS,  breath sounds clear to auscultation  Pulmonary exam normal       Cardiovascular Exercise Tolerance: Good negative cardio ROS Normal cardiovascular examRhythm:regular Rate:Normal     Neuro/Psych negative neurological ROS  negative psych ROS   GI/Hepatic negative GI ROS, Neg liver ROS,   Endo/Other  negative endocrine ROS  Renal/GU negative Renal ROS  negative genitourinary   Musculoskeletal   Abdominal   Peds  Hematology negative hematology ROS (+) Profound thrombocytopenia.  Will plan GOT   Anesthesia Other Findings   Reproductive/Obstetrics (+) Pregnancy                             Anesthesia Physical Anesthesia Plan  ASA: II and emergent  Anesthesia Plan: General ETT   Post-op Pain Management:    Induction:   Airway Management Planned:   Additional Equipment:   Intra-op Plan:   Post-operative Plan:   Informed Consent: I have reviewed the patients History and Physical, chart, labs and discussed the procedure including the risks, benefits and alternatives for the proposed anesthesia with the patient or authorized representative who has indicated his/her understanding and acceptance.     Plan Discussed with: CRNA  Anesthesia Plan Comments:         Anesthesia Quick Evaluation

## 2014-08-15 ENCOUNTER — Encounter: Payer: Self-pay | Admitting: Obstetrics and Gynecology

## 2014-08-15 DIAGNOSIS — D696 Thrombocytopenia, unspecified: Secondary | ICD-10-CM | POA: Diagnosis present

## 2014-08-15 DIAGNOSIS — O99119 Other diseases of the blood and blood-forming organs and certain disorders involving the immune mechanism complicating pregnancy, unspecified trimester: Secondary | ICD-10-CM | POA: Diagnosis present

## 2014-08-15 LAB — CBC WITH DIFFERENTIAL/PLATELET
Basophils Absolute: 0 10*3/uL (ref 0–0.1)
Eosinophils Absolute: 0 10*3/uL (ref 0–0.7)
HCT: 23.8 % — ABNORMAL LOW (ref 35.0–47.0)
Hemoglobin: 7.7 g/dL — ABNORMAL LOW (ref 12.0–16.0)
LYMPHS ABS: 1.6 10*3/uL (ref 1.0–3.6)
MCH: 28.4 pg (ref 26.0–34.0)
MCHC: 32.2 g/dL (ref 32.0–36.0)
MCV: 88.1 fL (ref 80.0–100.0)
MONO ABS: 1.2 10*3/uL — AB (ref 0.2–0.9)
Neutro Abs: 8.5 10*3/uL — ABNORMAL HIGH (ref 1.4–6.5)
Neutrophils Relative %: 75 %
PLATELETS: 72 10*3/uL — AB (ref 150–440)
RBC: 2.7 MIL/uL — ABNORMAL LOW (ref 3.80–5.20)
RDW: 13.9 % (ref 11.5–14.5)
WBC: 11.4 10*3/uL — ABNORMAL HIGH (ref 3.6–11.0)

## 2014-08-15 LAB — CBC
HCT: 27.7 % — ABNORMAL LOW (ref 35.0–47.0)
HEMOGLOBIN: 9.2 g/dL — AB (ref 12.0–16.0)
MCH: 29.2 pg (ref 26.0–34.0)
MCHC: 33.2 g/dL (ref 32.0–36.0)
MCV: 88.2 fL (ref 80.0–100.0)
PLATELETS: 80 10*3/uL — AB (ref 150–440)
RBC: 3.14 MIL/uL — AB (ref 3.80–5.20)
RDW: 13.7 % (ref 11.5–14.5)
WBC: 15.8 10*3/uL — ABNORMAL HIGH (ref 3.6–11.0)

## 2014-08-15 LAB — RPR: RPR: NONREACTIVE

## 2014-08-15 NOTE — Progress Notes (Signed)
Admit Date: 08/13/2014 Today's Date: 08/15/2014  Subjective: Postpartum Day 1: Cesarean Delivery Patient reports incisional pain.  Bakri not bothering pt.  Min appetite.  Objective: Vital signs in last 24 hours: Temp:  [97.6 F (36.4 C)-99.7 F (37.6 C)] 98.4 F (36.9 C) (08/02 0752) Pulse Rate:  [69-147] 130 (08/02 0752) Resp:  [16-28] 16 (08/02 0853) BP: (72-128)/(30-93) 126/57 mmHg (08/02 0752) SpO2:  [99 %-100 %] 99 % (08/02 0752)  Physical Exam:  General: alert, cooperative and appears stated age Lochia: appropriate, Bakri drain output 290 since insertion, minimal this am Uterine Fundus: firm Incision: dressing c/d/i DVT Evaluation: No evidence of DVT seen on physical exam.   Recent Labs  08/14/14 2153 08/15/14 0529  HGB 10.9* 9.2*  HCT 33.4* 27.7*  PLT       86              80 (this am)  Assessment/Plan: Status post Cesarean section. Doing well postoperatively.  Continue current care. Remove volume incremental from Bakri and monitor bleeding. Keep foley until Bakri out.  Unable to ambulate or move much w Bakri in for [facility].   CBC today.  Marquasha Brutus PAUL 08/15/2014, 9:15 AM

## 2014-08-15 NOTE — Progress Notes (Signed)
Admit Date: 08/13/2014 Today's Date: 08/15/2014  Subjective: Postpartum Day 1: Cesarean Delivery Patient reports min incisional pain.  Some appetite.  Objective: Vital signs in last 24 hours: Temp:  [97.6 F (36.4 C)-99.7 F (37.6 C)] 99.1 F (37.3 C) (08/02 2009) Pulse Rate:  [98-130] 98 (08/02 2009) Resp:  [16-28] 18 (08/02 2009) BP: (100-126)/(50-84) 120/50 mmHg (08/02 2009) SpO2:  [99 %-100 %] 100 % (08/02 2009)  Physical Exam:  General: alert, cooperative and appears stated age Lochia: appropriate, Bakri drain output  minimal     BAKRI DEFLATED AND REMOVED easily; no bleeding or pain immediately afterwards Uterine Fundus: firm Incision: dressing c/d/i DVT Evaluation: No evidence of DVT seen on physical exam.  Assessment/Plan: Status post Cesarean section. Doing well postoperatively.  Continue current care. Monitor for bleeding now that Bakri is out.  CBC in am as well.  Tyriek Hofman PAUL 08/15/2014, 10:38 PM

## 2014-08-15 NOTE — Progress Notes (Signed)
Called by RN around 0100 to evaluate bleeding from Bakri balloon. Approximately 100 cc blood out of catheter in the previous hour. Fundus firm and peripad with small amount of drainage. Pt re-evaluated around 0120 with small amount of blood in collection bag. RN to continue to monitor and notify me if bleeding increases.

## 2014-08-16 LAB — CBC WITH DIFFERENTIAL/PLATELET
Basophils Absolute: 0 10*3/uL (ref 0–0.1)
Basophils Relative: 0 %
EOS ABS: 0 10*3/uL (ref 0–0.7)
Eosinophils Relative: 0 %
HCT: 22 % — ABNORMAL LOW (ref 35.0–47.0)
Hemoglobin: 7.4 g/dL — ABNORMAL LOW (ref 12.0–16.0)
Lymphocytes Relative: 19 %
Lymphs Abs: 2 10*3/uL (ref 1.0–3.6)
MCH: 29.5 pg (ref 26.0–34.0)
MCHC: 33.7 g/dL (ref 32.0–36.0)
MCV: 87.7 fL (ref 80.0–100.0)
MONO ABS: 1.1 10*3/uL — AB (ref 0.2–0.9)
Neutro Abs: 7.5 10*3/uL — ABNORMAL HIGH (ref 1.4–6.5)
Neutrophils Relative %: 70 %
PLATELETS: 85 10*3/uL — AB (ref 150–440)
RBC: 2.51 MIL/uL — ABNORMAL LOW (ref 3.80–5.20)
RDW: 14.2 % (ref 11.5–14.5)
WBC: 10.6 10*3/uL (ref 3.6–11.0)

## 2014-08-16 LAB — HEMOGLOBIN AND HEMATOCRIT, BLOOD
HCT: 28.4 % — ABNORMAL LOW (ref 35.0–47.0)
Hemoglobin: 9.3 g/dL — ABNORMAL LOW (ref 12.0–16.0)

## 2014-08-16 LAB — PREPARE RBC (CROSSMATCH)

## 2014-08-16 LAB — PREPARE PLATELET PHERESIS: Unit division: 0

## 2014-08-16 MED ORDER — SODIUM CHLORIDE 0.9 % IV SOLN
Freq: Once | INTRAVENOUS | Status: AC
  Administered 2014-08-16: 11:00:00 via INTRAVENOUS

## 2014-08-16 MED ORDER — ACETAMINOPHEN 500 MG PO TABS
1000.0000 mg | ORAL_TABLET | Freq: Once | ORAL | Status: AC
  Administered 2014-08-16: 1000 mg via ORAL
  Filled 2014-08-16: qty 2

## 2014-08-16 MED ORDER — OXYCODONE-ACETAMINOPHEN 5-325 MG PO TABS
1.0000 | ORAL_TABLET | ORAL | Status: DC | PRN
  Administered 2014-08-16: 1 via ORAL
  Administered 2014-08-17 – 2014-08-18 (×7): 2 via ORAL
  Filled 2014-08-16 (×8): qty 2
  Filled 2014-08-16: qty 1

## 2014-08-16 MED ORDER — AMMONIA AROMATIC IN INHA
RESPIRATORY_TRACT | Status: AC
  Filled 2014-08-16: qty 10

## 2014-08-16 NOTE — Progress Notes (Signed)
POD #2 (36 hours) s/p CS for arrest of labor c/b PPH and thrombocytopenia. Subjective:  Feeling OK, tired. Bleeding WNL since Bakari removed last night. Foley discontinued this AM. Feels like she needs to void. Has not been OOB yet. Tolerating regular diet   Objective:  Blood pressure 112/60, pulse 115, temperature 97.5 F (36.4 C), temperature source Oral, resp. rate 18, height _0  (1.651 m), weight 197 lb (89.359 kg), last menstrual period 10/30/2013, SpO2 100 %,   Intake 1233 IV Urine output: 3800 Bakari: 60 ml.  General: NAD, CO2 detector on -has been showing normal CO2 levels Heart: tachycardia with Grade III/VI systolic murmur Pulmonary: no increased work of breathing, decreased breath sounds in bases. Abdomen: non-distended, tender fundus firm at level of umbilicus, BS present Incision: Dressing C+D+I Extremities: SCDs on H&H 7.4/22% this AM. Platelets up to 85K this AM (were 72K last night) Results for orders placed or performed during the hospital encounter of 08/13/14 (from the past 72 hour(s))  CBC     Status: Abnormal   Collection Time: 08/13/14  9:15 PM  Result Value Ref Range   WBC 10.0 3.6 - 11.0 K/uL   RBC 4.21 3.80 - 5.20 MIL/uL   Hemoglobin 12.2 12.0 - 16.0 g/dL   HCT 36.3 35.0 - 47.0 %   MCV 86.2 80.0 - 100.0 fL   MCH 28.9 26.0 - 34.0 pg   MCHC 33.6 32.0 - 36.0 g/dL   RDW 13.8 11.5 - 14.5 %   Platelets 82 (L) 150 - 440 K/uL  Type and screen     Status: None   Collection Time: 08/13/14  9:15 PM  Result Value Ref Range   ABO/RH(D) B POS    Antibody Screen NEG    Sample Expiration 08/16/2014   RPR     Status: None   Collection Time: 08/13/14  9:15 PM  Result Value Ref Range   RPR Ser Ql Non Reactive Non Reactive    Comment: (NOTE) Performed At: [facility] [place], [place] [id number] Lindon Romp MD LY:[id number]   ABO/Rh     Status: None   Collection Time: 08/13/14  9:15 PM  Result Value Ref Range   ABO/RH(D) B POS    Comprehensive metabolic panel     Status: Abnormal   Collection Time: 08/13/14  9:15 PM  Result Value Ref Range   Sodium 136 135 - 145 mmol/L   Potassium 3.3 (L) 3.5 - 5.1 mmol/L   Chloride 109 101 - 111 mmol/L   CO2 19 (L) 22 - 32 mmol/L   Glucose, Bld 101 (H) 65 - 99 mg/dL   BUN 5 (L) 6 - 20 mg/dL   Creatinine, Ser 0.51 0.44 - 1.00 mg/dL   Calcium 8.7 (L) 8.9 - 10.3 mg/dL   Total Protein 6.7 6.5 - 8.1 g/dL   Albumin 3.1 (L) 3.5 - 5.0 g/dL   AST 23 15 - 41 U/L   ALT 15 14 - 54 U/L   Alkaline Phosphatase 231 (H) 38 - 126 U/L   Total Bilirubin 1.4 (H) 0.3 - 1.2 mg/dL   GFR calc non Af Amer >60 >60 mL/min   GFR calc Af Amer >60 >60 mL/min    Comment: (NOTE) The eGFR has been calculated using the CKD EPI equation. This calculation has not been validated in all clinical situations. eGFR's persistently <60 mL/min signify possible Chronic Kidney Disease.    Anion gap 8 5 - 15  Chlamydia/NGC rt PCR [facility]  only)     Status: None   Collection Time: 08/13/14  9:16 PM  Result Value Ref Range   Specimen source GC/Chlam URINE, CLEAN CATCH    Chlamydia Tr NOT DETECTED NOT DETECTED   N gonorrhoeae NOT DETECTED NOT DETECTED    Comment: (NOTE) 100  This methodology has not been evaluated in pregnant women or in 200  patients with a history of hysterectomy. 300 400  This methodology will not be performed on patients less than 72  years of age.   Prepare Pheresed Platelets     Status: None   Collection Time: 08/14/14  6:00 PM  Result Value Ref Range   Unit Number [id number]    Blood Component Type PLTP LR1 PAS    Unit division 00    Status of Unit REL FROM [facility]    Transfusion Status OK TO TRANSFUSE   Platelet count     Status: Abnormal   Collection Time: 08/14/14  7:07 PM  Result Value Ref Range   Platelets 66 (L) 150 - 400 K/uL    Comment: RESULT REPEATED AND VERIFIED  CBC     Status: Abnormal   Collection Time: 08/14/14  9:53 PM  Result Value Ref Range   WBC 19.4 (H) 3.6 -  11.0 K/uL   RBC 3.79 (L) 3.80 - 5.20 MIL/uL   Hemoglobin 10.9 (L) 12.0 - 16.0 g/dL   HCT 33.4 (L) 35.0 - 47.0 %   MCV 88.2 80.0 - 100.0 fL   MCH 28.7 26.0 - 34.0 pg   MCHC 32.6 32.0 - 36.0 g/dL   RDW 13.9 11.5 - 14.5 %   Platelets 86 (L) 150 - 440 K/uL  Fibrinogen     Status: None   Collection Time: 08/14/14  9:53 PM  Result Value Ref Range   Fibrinogen 463 210 - 470 mg/dL  Protime-INR     Status: Abnormal   Collection Time: 08/14/14  9:53 PM  Result Value Ref Range   Prothrombin Time 16.9 (H) 11.4 - 15.0 seconds   INR 1.35   APTT     Status: None   Collection Time: 08/14/14  9:53 PM  Result Value Ref Range   aPTT 29 24 - 36 seconds  CBC     Status: Abnormal   Collection Time: 08/15/14  5:29 AM  Result Value Ref Range   WBC 15.8 (H) 3.6 - 11.0 K/uL   RBC 3.14 (L) 3.80 - 5.20 MIL/uL   Hemoglobin 9.2 (L) 12.0 - 16.0 g/dL   HCT 27.7 (L) 35.0 - 47.0 %   MCV 88.2 80.0 - 100.0 fL   MCH 29.2 26.0 - 34.0 pg   MCHC 33.2 32.0 - 36.0 g/dL   RDW 13.7 11.5 - 14.5 %   Platelets 80 (L) 150 - 440 K/uL  CBC with Differential/Platelet     Status: Abnormal   Collection Time: 08/15/14  3:38 PM  Result Value Ref Range   WBC 11.4 (H) 3.6 - 11.0 K/uL   RBC 2.70 (L) 3.80 - 5.20 MIL/uL   Hemoglobin 7.7 (L) 12.0 - 16.0 g/dL   HCT 23.8 (L) 35.0 - 47.0 %   MCV 88.1 80.0 - 100.0 fL   MCH 28.4 26.0 - 34.0 pg   MCHC 32.2 32.0 - 36.0 g/dL   RDW 13.9 11.5 - 14.5 %   Platelets 72 (L) 150 - 440 K/uL   Neutrophils Relative % 75% %   Neutro Abs 8.5 (H) 1.4 - 6.5 K/uL  Lymphocytes Relative 14% %   Lymphs Abs 1.6 1.0 - 3.6 K/uL   Monocytes Relative 11% %   Monocytes Absolute 1.2 (H) 0.2 - 0.9 K/uL   Eosinophils Relative 0% %   Eosinophils Absolute 0.0 0 - 0.7 K/uL   Basophils Relative 0% %   Basophils Absolute 0.0 0 - 0.1 K/uL  CBC with Differential/Platelet     Status: Abnormal   Collection Time: 08/16/14  4:38 AM  Result Value Ref Range   WBC 10.6 3.6 - 11.0 K/uL   RBC 2.51 (L) 3.80 - 5.20  MIL/uL   Hemoglobin 7.4 (L) 12.0 - 16.0 g/dL   HCT 22.0 (L) 35.0 - 47.0 %   MCV 87.7 80.0 - 100.0 fL   MCH 29.5 26.0 - 34.0 pg   MCHC 33.7 32.0 - 36.0 g/dL   RDW 14.2 11.5 - 14.5 %   Platelets 85 (L) 150 - 440 K/uL   Neutrophils Relative % 70% %   Neutro Abs 7.5 (H) 1.4 - 6.5 K/uL   Lymphocytes Relative 19% %   Lymphs Abs 2.0 1.0 - 3.6 K/uL   Monocytes Relative 11% %   Monocytes Absolute 1.1 (H) 0.2 - 0.9 K/uL   Eosinophils Relative 0% %   Eosinophils Absolute 0.0 0 - 0.7 K/uL   Basophils Relative 0% %   Basophils Absolute 0.0 0 - 0.1 K/uL     Assessment:   24 y.o. G2P2001 postoperativeday #2   Plan:  1) Acute blood loss anemia - tachycardic. Has not been OOB yet. No CP or SOB. Discussed pros and cons of blood transfusion vs long term iron supplementation. Feel blood transfusion is warranted given her thrombocytopenia, having had a Cesarean section   , and going home to take care of a newborn and a small child . Patient  agrees to receiving blood after reviewing  risks of blood transfusion.  Informed consent obtained for blood transfusion. Will get H&H after first unit and reevaluate whether a second is needed.   2) --/--/B POS, B POS (07/31 2115) / Rubella Immune (02/23 0930) / Varicella Immune-not a RHogam candidate  3.) Trial of voiding-will need assist in getting OOB  4.) DC PCA and start po analgesics.  5.) Monitor bleeding with increased activity  Sharda Keddy, CNM

## 2014-08-16 NOTE — Progress Notes (Signed)
PCA discontinued per MD order.  Pt received 19 ml.  6 ml wasted with San Jetty, RN as witness.  Reynold Bowen, RN 08/16/2014 12:00 PM

## 2014-08-17 LAB — TYPE AND SCREEN
ABO/RH(D): B POS
Antibody Screen: NEGATIVE
Unit division: 0
Unit division: 0

## 2014-08-17 LAB — CBC
HCT: 28.5 % — ABNORMAL LOW (ref 35.0–47.0)
HEMOGLOBIN: 9.5 g/dL — AB (ref 12.0–16.0)
MCH: 29.5 pg (ref 26.0–34.0)
MCHC: 33.3 g/dL (ref 32.0–36.0)
MCV: 88.6 fL (ref 80.0–100.0)
PLATELETS: 105 10*3/uL — AB (ref 150–440)
RBC: 3.22 MIL/uL — AB (ref 3.80–5.20)
RDW: 13.9 % (ref 11.5–14.5)
WBC: 12.3 10*3/uL — ABNORMAL HIGH (ref 3.6–11.0)

## 2014-08-17 NOTE — Lactation Note (Signed)
This note was copied from the chart of Marissa Andrews. Lactation Consultation Note  Patient Name: Marissa Charlyne Velasques Today's Date: 08/17/2014     Maternal Data  Mother stated that she tried and hated breast feeding.  Feeding    LATCH Score/Interventions                      Lactation Tools Discussed/Used     Consult Status   complete   Trudee Grip 08/17/2014, 4:52 PM

## 2014-08-17 NOTE — Plan of Care (Signed)
Problem: Phase II Progression Outcomes Goal: Progress activity as tolerated unless otherwise ordered Outcome: Completed/Met Date Met:  08/17/14 Pt ambulating around room and bends at knees displaying proper body mechanics. Goal: Other Phase II Outcomes/Goals Outcome: Completed/Met Date Met:  08/17/14 Labs are trending back to normal ranges.

## 2014-08-17 NOTE — Progress Notes (Addendum)
  Post-operative Day 3  Subjective: Resting in bed, has not been OOB this morning yet per pt   Objective: Blood pressure 120/62, pulse 80, temperature 98.8 F (37.1 C), temperature source Oral, resp. rate 18, height  (1.651 m), weight 197 lb (89.359 kg), last menstrual period 10/30/2013, SpO2 100 %, unknown if currently breastfeeding.  Physical Exam:  General: alert and cooperative Lochia: appropriate Uterine Fundus: firm Incision: healing well, no significant drainage DVT Evaluation: No evidence of DVT seen on physical exam. Abdomen: soft, NT   Recent Labs  08/16/14 2033 08/17/14 0604  HGB 9.3* 9.5*  HCT 28.4* 28.5*   Hgb stable in the past 12 hours, 7.4 prior to blood transfusion  Assessment POD #3, blood loss anemia and thrombocytopenia, stabilizing after blood transfusion  Plan: Continue PO care and Advance activity as tolerated  Encouraged ambulation Dressing change after shower  Feeding: bottle RI/VI TDAP needs prior to d/c Blood Type: B+   Marta Antu, CNM 08/17/2014, 12:38 PM

## 2014-08-18 MED ORDER — OXYCODONE-ACETAMINOPHEN 5-325 MG PO TABS: 1.0000 | ORAL_TABLET | ORAL | Status: DC | PRN

## 2014-08-18 MED ORDER — IBUPROFEN 600 MG PO TABS: 600.0000 mg | ORAL_TABLET | Freq: Four times a day (QID) | ORAL | Status: DC | PRN

## 2014-08-18 MED ORDER — ACETAMINOPHEN 325 MG PO TABS: 650.0000 mg | ORAL_TABLET | ORAL | Status: DC | PRN

## 2014-08-18 NOTE — Discharge Instructions (Signed)
For 2 weeks:  No driving For 6 weeks:  No heavy lifting or strenuous activity For 6 weeks:  No tampons, douches, enemas or sexual intercourse  Call doctor or return to ER:  Temperature of 100.4 or higher; increased abdominal pain; increased vaginal bleeding (passing blood clots the size of your fist or larger OR having to change your pad every hour because it's saturated); chest pain or difficulty breathing; any concerns about postpartum depression (not wanting to see your husband or family, not wanting to see or care for the baby, not having your normal appetite, not having your normal emotions); or for any other concerns   Cesarean Delivery, Care After Refer to this sheet in the next few weeks. These instructions provide you with information on caring for yourself after your procedure. Your health care provider may also give you specific instructions. Your treatment has been planned according to current medical practices, but problems sometimes occur. Call your health care provider if you have any problems or questions after you go home. HOME CARE INSTRUCTIONS  Only take over-the-counter or prescription medications as directed by your health care provider.  Do not drink alcohol, especially if you are breastfeeding or taking medication to relieve pain.  Do not chew or smoke tobacco.  Continue to use good perineal care. Good perineal care includes:  Wiping your perineum from front to back.  Keeping your perineum clean.  Check your surgical cut (incision) daily for increased redness, drainage, swelling, or separation of skin.  Clean your incision gently with soap and water every day, and then pat it dry. If your health care provider says it is okay, leave the incision uncovered. Use a bandage (dressing) if the incision is draining fluid or appears irritated. If the adhesive strips across the incision do not fall off within 7 days, carefully peel them off.  Hug a pillow when coughing or  sneezing until your incision is healed. This helps to relieve pain.  Do not use tampons or douche until your health care provider says it is okay.  Shower, wash your hair, and take tub baths as directed by your health care provider.  Wear a well-fitting bra that provides breast support.  Limit wearing support panties or control-top hose.  Drink enough fluids to keep your urine clear or pale yellow.  Eat high-fiber foods such as whole grain cereals and breads, brown rice, beans, and fresh fruits and vegetables every day. These foods may help prevent or relieve constipation.  Resume activities such as climbing stairs, driving, lifting, exercising, or traveling as directed by your health care provider.  Talk to your health care provider about resuming sexual activities. This is dependent upon your risk of infection, your rate of healing, and your comfort and desire to resume sexual activity.  Try to have someone help you with your household activities and your newborn for at least a few days after you leave the hospital.  Rest as much as possible. Try to rest or take a nap when your newborn is sleeping.  Increase your activities gradually.  Keep all of your scheduled postpartum appointments. It is very important to keep your scheduled follow-up appointments. At these appointments, your health care provider will be checking to make sure that you are healing physically and emotionally. SEEK MEDICAL CARE IF:   You are passing large clots from your vagina. Save any clots to show your health care provider.  You have a foul smelling discharge from your vagina.  You have trouble  urinating.  You are urinating frequently.  You have pain when you urinate.  You have a change in your bowel movements.  You have increasing redness, pain, or swelling near your incision.  You have pus draining from your incision.  Your incision is separating.  You have painful, hard, or reddened  breasts.  You have a severe headache.  You have blurred vision or see spots.  You feel sad or depressed.  You have thoughts of hurting yourself or your newborn.  You have questions about your care, the care of your newborn, or medications.  You are dizzy or light-headed.  You have a rash.  You have pain, redness, or swelling at the site of the removed intravenous access (IV) tube.  You have nausea or vomiting.  You stopped breastfeeding and have not had a menstrual period within 12 weeks of stopping.  You are not breastfeeding and have not had a menstrual period within 12 weeks of delivery.  You have a fever. SEEK IMMEDIATE MEDICAL CARE IF:  You have persistent pain.  You have chest pain.  You have shortness of breath.  You faint.  You have leg pain.  You have stomach pain.  Your vaginal bleeding saturates 2 or more sanitary pads in 1 hour. MAKE SURE YOU:   Understand these instructions.  Will watch your condition.  Will get help right away if you are not doing well or get worse. Document Released: 09/21/2001 Document Revised: 05/16/2013 Document Reviewed: 08/27/2011 [facility] Patient Information 2015 [place], [place]. This information is not intended to replace advice given to you by your health care provider. Make sure you discuss any questions you have with your health care provider.

## 2014-08-18 NOTE — Progress Notes (Signed)
Pt discharged home with infant.  Discharge instructions and follow up appointment given to and reviewed with pt.  Pt verbalized understanding.  Escorted by auxillary. 

## 2014-08-18 NOTE — Discharge Summary (Signed)
Obstetrical Discharge Summary  Date of Admission: 08/13/2014 Date of Discharge: 08/18/2014  Primary OB: [facility]  Gestational Age at Delivery: [redacted]w[redacted]d   Antepartum complications:  1. Late to care 2. History of genital HSV, on valtrex since 36w 3. Obesity, BMI 31 4. Thrombocytopenia - highest 131, latest: 7/27 = 94k 5. +GC/CT on NOB, on TOC still +CT, last TOC was 5/25, neg for both  Reason for Admission: IOL for Past due date Date of Delivery:  08/14/14 Delivered By: Vena Austria Delivery Type: primary cesarean section, low transverse incision Intrapartum complications/course: thrombocytopenia with platelets down to 66 s/p platelet transfusion and bakri balloon for post partum hemorrhage.   Anesthesia: epidural/ spinal Placenta: sponatneous Laceration: n/a Episiotomy: none Newborn Data: Live born female  Birth Weight: 7 lb 4.1 oz (3290 g) APGAR: 8, 9    Discharge Physical Exam:  General: NAD CV: RRR Pulm: CTABL, nl effort ABD: s/nd/nt, fundus firm and below the umbilicus Lochia: moderate Incision: c/d/i - staples DVT Evaluation: LE non-ttp, no evidence of DVT on exam.  HEMOGLOBIN  Date Value Ref Range Status  08/17/2014 9.5* 12.0 - 16.0 g/dL Final   HGB  Date Value Ref Range Status  01/14/2014 13.7 12.0-16.0 g/dL Final   HCT  Date Value Ref Range Status  08/17/2014 28.5* 35.0 - 47.0 % Final  01/14/2014 40.0 35.0-47.0 % Final    Post partum course: received platelets, with improvement of platelets to 105 at discharge, bakri balloon placed and removed after uterine tone improved. Postpartum Procedures:see above Disposition: stable, discharge to home.  Rh Immune globulin given: no Rubella vaccine given:  no Tdap vaccine given in AP or PP setting: UTD Flu vaccine given in AP or PP setting:  no  Contraception: TBD at 6w visit  Prenatal Labs:  Blood type/Rh B+  Antibody screen neg  Rubella varicella Immune immune  RPR NR  HBsAg neg  HIV neg   GC pos  Chlamydia pos  Genetic screening declined  1 hour GTT 80  3 hour GTT neg  GBS neg  Platelets: 7/27 = 94        Plan:  Marissa Andrews was discharged to home in good condition. Follow-up appointment at [facility] with Dr Bonney Aid for staple removal in 1 week.   Discharge Medications:   Medication List    STOP taking these medications        valACYclovir 500 MG tablet  Commonly known as:  VALTREX      TAKE these medications        acetaminophen 325 MG tablet  Commonly known as:  TYLENOL  Take 2 tablets (650 mg total) by mouth every 4 (four) hours as needed (for pain scale < 4).     ibuprofen 600 MG tablet  Commonly known as:  ADVIL,MOTRIN  Take 1 tablet (600 mg total) by mouth every 6 (six) hours as needed.     oxyCODONE-acetaminophen 5-325 MG per tablet  Commonly known as:  PERCOCET/ROXICET  Take 1-2 tablets by mouth every 4 (four) hours as needed for moderate pain or severe pain.        Signed: ----- Ranae Plumber, MD Attending Obstetrician and Gynecologist [facility]

## 2016-04-06 IMAGING — US US OB < 14 WEEKS
1 series · 14 of 28 positions shown · non-contrast
Comparison: None.

CLINICAL DATA: Acute onset of generalized abdominal pain. Initial
encounter.

EXAM:
OBSTETRIC <14 WK ULTRASOUND
TECHNIQUE: Transabdominal ultrasound was performed for evaluation of the
gestation as well as the maternal uterus and adnexal regions.

[Series 1: us ob < 14 weeks · 0.21mm/px · 14 of 45 slices shown]
[im 2/45]
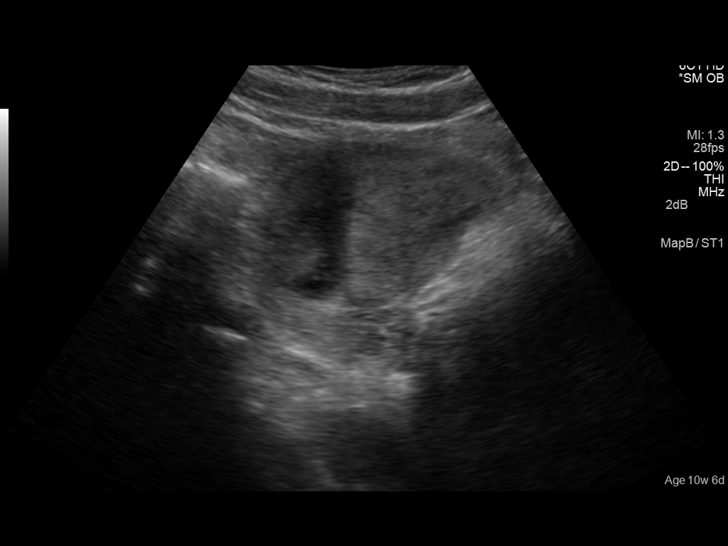
[im 5/45]
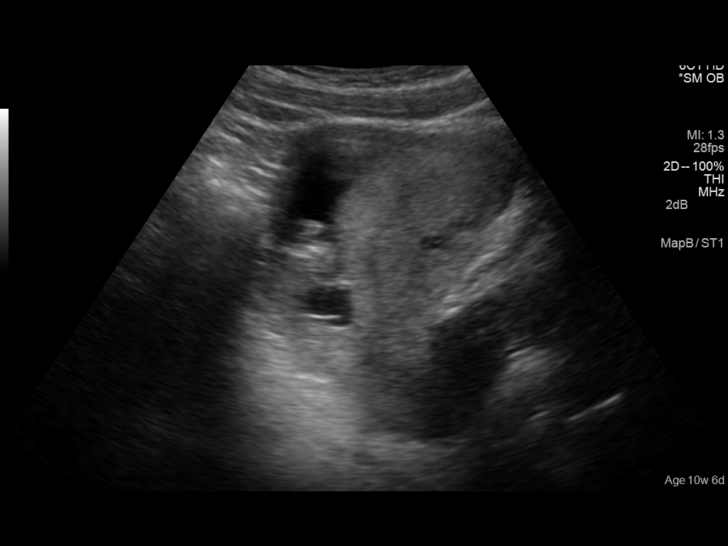
[im 9/45]
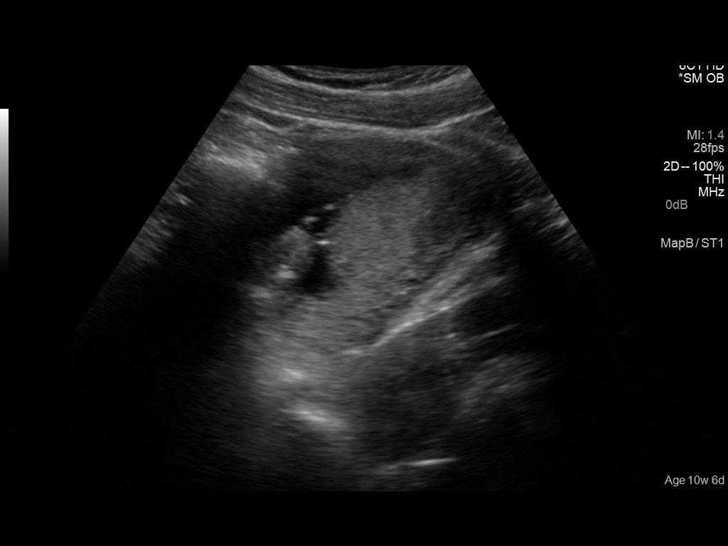
[im 12/45]
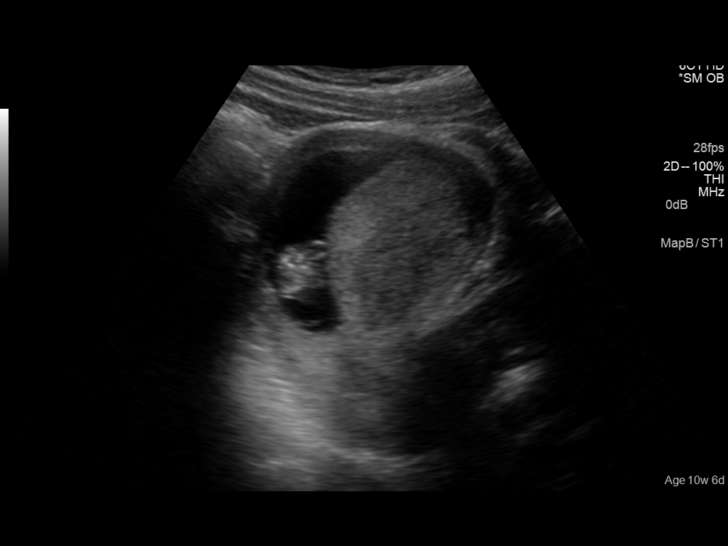
[im 15/45]
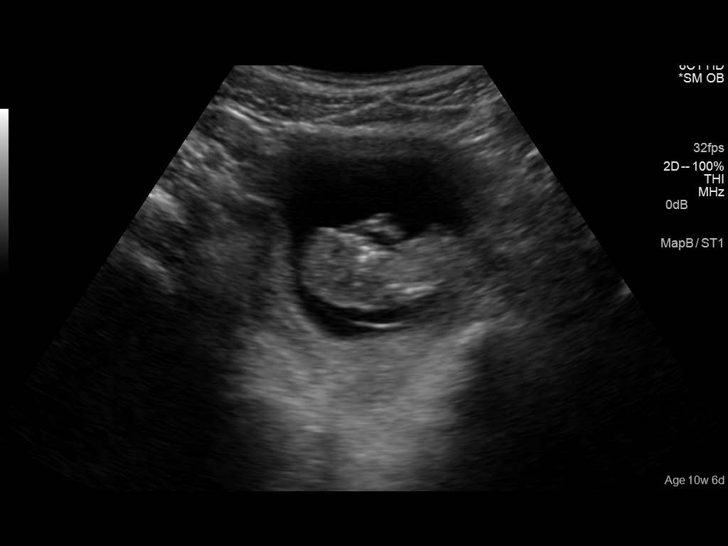
[im 18/45]
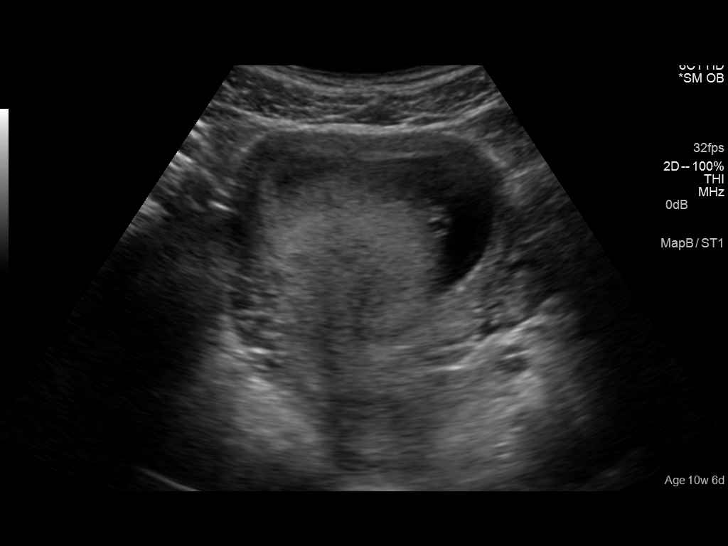
[im 22/45]
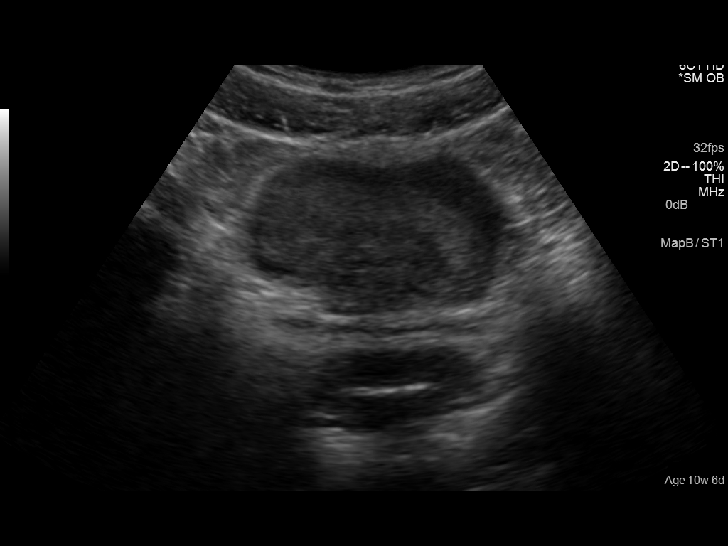
[im 25/45]
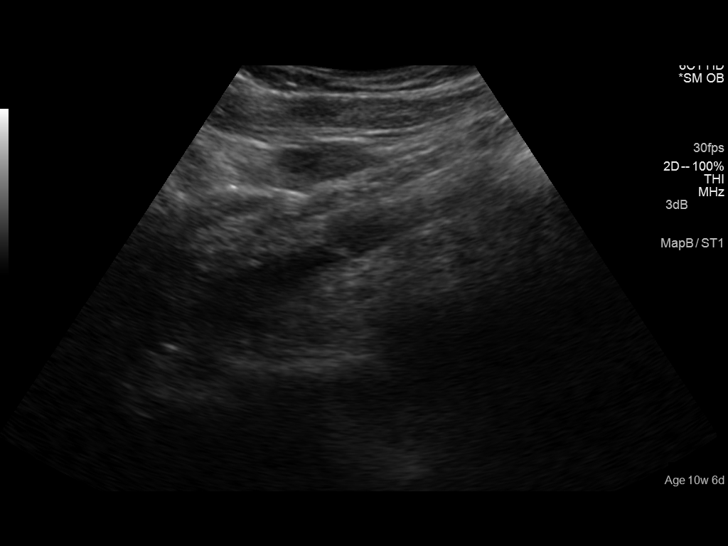
[im 28/45]
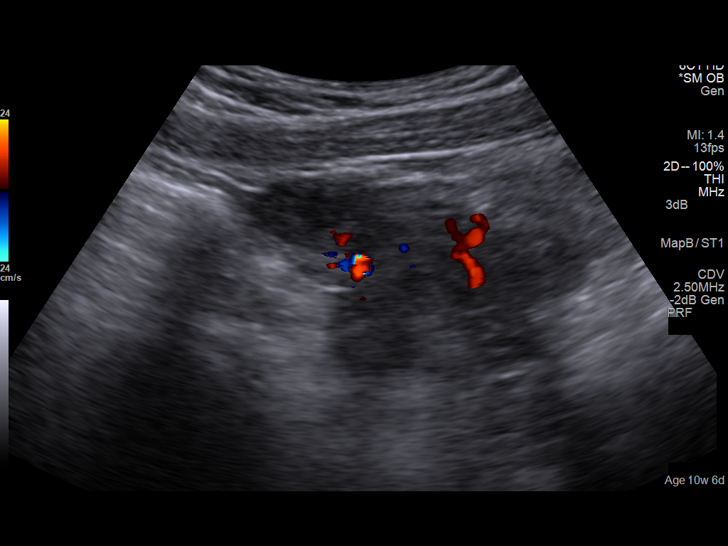
[im 31/45]
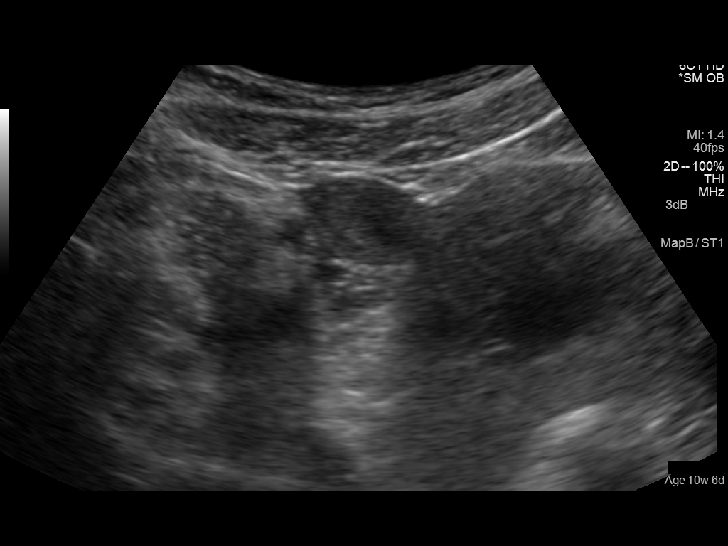
[im 35/45]
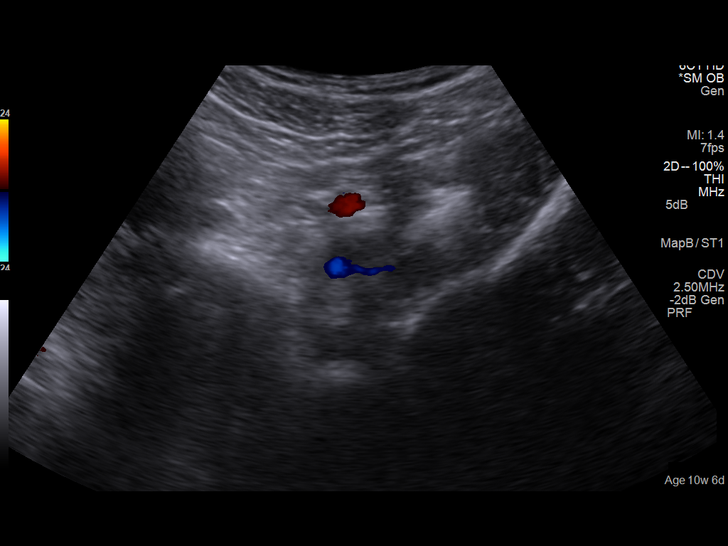
[im 38/45]
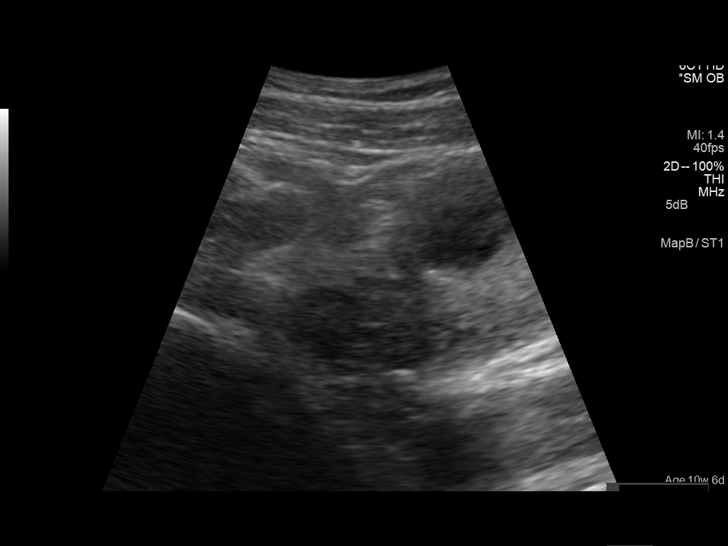
[im 41/45]
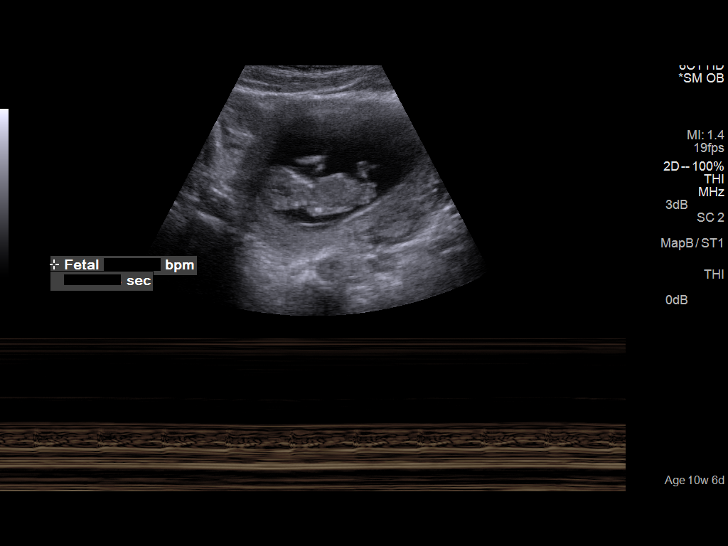
[im 45/45]
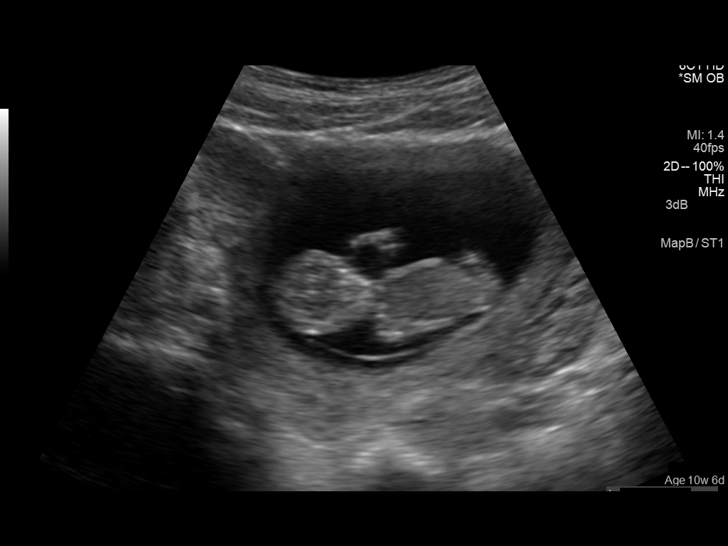

[14 of 28 positions shown; findings below may reference images not displayed]

FINDINGS: Intrauterine gestational sac: Visualized/normal in shape.

Yolk sac:  No

Embryo:  Yes

Cardiac Activity: Yes

Heart Rate: 160 bpm

CRL:   4.77 cm   11 w 4 d                  US EDC: 08/01/2014

Maternal uterus/adnexae: No subchorionic hemorrhage is noted. The
uterus is otherwise unremarkable in appearance.

The ovaries are within normal limits. The right ovary measures 4.0 x
1.8 x 2.3 cm, while the left ovary measures 3.1 x 1.8 x 2.5 cm. No
suspicious adnexal masses are seen; there is no evidence for ovarian
torsion.

No free fluid is seen within the pelvic cul-de-sac.
IMPRESSION: Single live intrauterine pregnancy noted, with a crown-rump length
of 4.8 cm, corresponding to a gestational age of 11 weeks 4 days.
This matches the gestational age of 10 weeks 6 days by LMP,
reflecting an estimated date of delivery August 06, 2014.

## 2016-06-21 DIAGNOSIS — Z5321 Procedure and treatment not carried out due to patient leaving prior to being seen by health care provider: Secondary | ICD-10-CM | POA: Insufficient documentation

## 2016-06-21 DIAGNOSIS — R109 Unspecified abdominal pain: Secondary | ICD-10-CM | POA: Diagnosis not present

## 2016-06-22 ENCOUNTER — Encounter: Payer: Self-pay | Admitting: Emergency Medicine

## 2016-06-22 ENCOUNTER — Emergency Department
Admission: EM | Admit: 2016-06-22 | Discharge: 2016-06-22 | Disposition: A | Discharge status: Home / Self Care | Payer: Medicaid Other | Attending: Emergency Medicine | Admitting: Emergency Medicine

## 2016-06-22 NOTE — ED Notes (Signed)
Dr. Zenda AlpersWebster consulted about pt's case, no orders at this time

## 2016-06-22 NOTE — ED Triage Notes (Signed)
Pt ambulatory to triage in NAD, report had c-section 1 year ago, over past two days has had burning and sharp pain to site.  No obvious swelling or drainage.  Pt VS WNL in triage.

## 2016-06-23 ENCOUNTER — Telehealth: Payer: Self-pay | Admitting: Emergency Medicine

## 2016-06-23 NOTE — Telephone Encounter (Signed)
Called patient due to lwot to inquire about condition and follow up plans.  No answer and no voicemail  

## 2017-09-21 ENCOUNTER — Telehealth: Payer: Self-pay | Admitting: Obstetrics & Gynecology

## 2017-09-21 NOTE — Telephone Encounter (Signed)
[facility] health referring for pregnancy. Called and left voicemail for patient to call back to be  schedule

## 2017-09-24 ENCOUNTER — Other Ambulatory Visit (HOSPITAL_COMMUNITY)
Admission: RE | Admit: 2017-09-24 | Discharge: 2017-09-24 | Disposition: A | Discharge status: Home / Self Care | Payer: Medicaid Other | Source: Ambulatory Visit | Attending: Obstetrics and Gynecology | Admitting: Obstetrics and Gynecology

## 2017-09-24 ENCOUNTER — Ambulatory Visit (INDEPENDENT_AMBULATORY_CARE_PROVIDER_SITE_OTHER): Payer: Medicaid Other | Admitting: Obstetrics and Gynecology

## 2017-09-24 ENCOUNTER — Encounter: Payer: Self-pay | Admitting: Obstetrics and Gynecology

## 2017-09-24 VITALS — BP 114/62 | Wt 200.0 lb

## 2017-09-24 DIAGNOSIS — O09291 Supervision of pregnancy with other poor reproductive or obstetric history, first trimester: Secondary | ICD-10-CM

## 2017-09-24 DIAGNOSIS — Z3A01 Less than 8 weeks gestation of pregnancy: Secondary | ICD-10-CM | POA: Diagnosis not present

## 2017-09-24 DIAGNOSIS — Z862 Personal history of diseases of the blood and blood-forming organs and certain disorders involving the immune mechanism: Secondary | ICD-10-CM

## 2017-09-24 DIAGNOSIS — Z3481 Encounter for supervision of other normal pregnancy, first trimester: Secondary | ICD-10-CM | POA: Diagnosis not present

## 2017-09-24 DIAGNOSIS — Z124 Encounter for screening for malignant neoplasm of cervix: Secondary | ICD-10-CM

## 2017-09-24 LAB — POCT URINALYSIS DIPSTICK OB: GLUCOSE, UA: NEGATIVE

## 2017-09-24 NOTE — Progress Notes (Signed)
NOB C/o some LQ pain not at all times  Declines flu shot

## 2017-09-24 NOTE — Progress Notes (Signed)
09/25/2017   Chief Complaint: Missed period  Transfer of Care Patient: no  History of Present Illness: [name] is a 27 y.o. [id number] 6843w1d based on Patient's last menstrual period was 08/13/2017. with an Estimated Date of Delivery: 05/20/18, with the above CC.   Her periods were: regular periods every 30 days She was using no method when she conceived.  She has Negative signs or symptoms of nausea/vomiting of pregnancy. She has Negative signs or symptoms of miscarriage or preterm labor She identifies Negative Zika risk factors for her and her partner On any different medications around the time she conceived/early pregnancy: No  History of varicella: Yes   ROS: A 12-point review of systems was performed and negative, except as stated in the above HPI.  OBGYN History: As per HPI. OB History  Gravida Para Term Preterm AB Living  5 3 2 1 1 2   SAB TAB Ectopic Multiple Live Births  1     0 2    # Outcome Date GA Lbr Len/2nd Weight Sex Delivery Anes PTL Lv  5 Current           4 SAB 05/2017          3 Preterm 04/26/15 [redacted]w[redacted]d   M Vag-Spont   FD  2 Term 08/14/14 9441w0d  7 lb 4.1 oz (3.29 kg) F CS-LTranv Gen  LIV     Birth Comments: none  1 Term 04/06/10 5844w0d  6 lb 14 oz (3.118 kg) F Vag-Spont  N LIV     Any issues with any prior pregnancies: yes Any prior children are healthy, doing well, without any problems or issues: no History of pap smears: Yes. Last pap smear  . Abnormal: unknown History of STIs: No   Past Medical History: History reviewed. No pertinent past medical history.  Past Surgical History: Past Surgical History:  Procedure Laterality Date  . APPENDECTOMY     Pt reports appendectomy in 2006  . CESAREAN SECTION N/A 08/14/2014   Procedure: CESAREAN SECTION;  Surgeon: Vena AustriaAndreas Staebler, MD;  Location: [facility] ORS;  Service: Obstetrics;  Laterality: N/A;    Family History:  History reviewed. No pertinent family history. She denies any female cancers, bleeding or blood  clotting disorders.  She denies any history of mental retardation, birth defects or genetic disorders in her or the FOB's history  Social History:  Social History   Socioeconomic History  . Marital status: Single    Spouse name: Not on file  . Number of children: Not on file  . Years of education: Not on file  . Highest education level: Not on file  Occupational History  . Not on file  Social Needs  . Financial resource strain: Not on file  . Food insecurity:    Worry: Not on file    Inability: Not on file  . Transportation needs:    Medical: Not on file    Non-medical: Not on file  Tobacco Use  . Smoking status: Never Smoker  . Smokeless tobacco: Never Used  Substance and Sexual Activity  . Alcohol use: No  . Drug use: No  . Sexual activity: Yes  Lifestyle  . Physical activity:    Days per week: Not on file    Minutes per session: Not on file  . Stress: Not on file  Relationships  . Social connections:    Talks on phone: Not on file    Gets together: Not on file    Attends religious service: Not  on file    Active member of club or organization: Not on file    Attends meetings of clubs or organizations: Not on file    Relationship status: Not on file  . Intimate partner violence:    Fear of current or ex partner: Not on file    Emotionally abused: Not on file    Physically abused: Not on file    Forced sexual activity: Not on file  Other Topics Concern  . Not on file  Social History Narrative  . Not on file   Any pets in the household: no    Allergy: No Known Allergies  Current Outpatient Medications:  Current Outpatient Medications:  .  acetaminophen (TYLENOL) 325 MG tablet, Take 2 tablets (650 mg total) by mouth every 4 (four) hours as needed (for pain scale < 4)., Disp: , Rfl:  .  Prenat-FeFum-FePo-FA-Omega 3 (TARON-C DHA) 53.5-38-1 MG CAPS, Take 1 tablet by mouth daily., Disp: , Rfl: 2 .  sertraline (ZOLOFT) 100 MG tablet, Take 100 mg by mouth  daily., Disp: , Rfl: 1   Physical Exam:   BP 114/62   Wt 200 lb (90.7 kg)   LMP 08/13/2017   BMI 33.28 kg/m  Body mass index is 33.28 kg/m. Constitutional: Well nourished, well developed female in no acute distress.  Neck:  Supple, normal appearance, and no thyromegaly  Cardiovascular: S1, S2 normal, no murmur, rub or gallop, regular rate and rhythm Respiratory:  Clear to auscultation bilateral. Normal respiratory effort Abdomen: positive bowel sounds and no masses, hernias; diffusely non tender to palpation, non distended Breasts: breasts appear normal, no suspicious masses, no skin or nipple changes or axillary nodes. Neuro/Psych:  Normal mood and affect.  Skin:  Warm and dry.  Lymphatic:  No inguinal lymphadenopathy.   Pelvic exam: is not limited by body habitus EGBUS: within normal limits, Vagina: within normal limits and with no blood in the vault, Cervix: normal appearing cervix without discharge or lesions, closed/long/high, Uterus:  nonenlarged, and Adnexa:  normal adnexa  Assessment: [name] is a 27 y.o. Z6X0960 [redacted]w[redacted]d based on Patient's last menstrual period was 08/13/2017. with an Estimated Date of Delivery: 05/20/18,  for prenatal care.  Plan:  1) Avoid alcoholic beverages. 2) Patient encouraged not to smoke.  3) Discontinue the use of all non-medicinal drugs and chemicals.  4) Take prenatal vitamins daily.  5) Seatbelt use advised 6) Nutrition, food safety (fish, cheese advisories, and high nitrite foods) and exercise discussed. 7) Hospital and practice style delivering at [facility] discussed  8) Patient is asked about travel to areas at risk for the Zika virus, and counseled to avoid travel and exposure to mosquitoes or sexual partners who may have themselves been exposed to the virus. Testing is discussed, and will be ordered as appropriate.  9) Childbirth classes at [facility] advised 10) Genetic Screening, such as with 1st Trimester Screening, cell free fetal DNA, AFP  testing, and Ultrasound, as well as with amniocentesis and CVS as appropriate, is discussed with patient. She plans to have genetic testing this pregnancy.  Problem list reviewed and updated.  Adelene Idler MD, [facility], [facility] 03/19/2020 7:52 AM

## 2017-09-25 DIAGNOSIS — O09291 Supervision of pregnancy with other poor reproductive or obstetric history, first trimester: Secondary | ICD-10-CM | POA: Insufficient documentation

## 2017-09-25 LAB — URINE DRUG PANEL 7
Amphetamines, Urine: NEGATIVE ng/mL
Barbiturate Quant, Ur: NEGATIVE ng/mL
Benzodiazepine Quant, Ur: NEGATIVE ng/mL
COCAINE (METAB.): NEGATIVE ng/mL
Cannabinoid Quant, Ur: NEGATIVE ng/mL
Opiate Quant, Ur: NEGATIVE ng/mL
PCP Quant, Ur: NEGATIVE ng/mL

## 2017-09-26 LAB — URINE CULTURE

## 2017-09-28 LAB — CYTOLOGY - PAP
Chlamydia: NEGATIVE
Diagnosis: NEGATIVE
HPV (WINDOPATH): NOT DETECTED
Neisseria Gonorrhea: NEGATIVE
Trichomonas: NEGATIVE

## 2017-09-30 LAB — RPR+RH+ABO+RUB AB+AB SCR+CB...
ANTIBODY SCREEN: NEGATIVE
HIV Screen 4th Generation wRfx: NONREACTIVE
Hematocrit: 37.7 % (ref 34.0–46.6)
Hemoglobin: 12.5 g/dL (ref 11.1–15.9)
Hepatitis B Surface Ag: NEGATIVE
MCH: 29.8 pg (ref 26.6–33.0)
MCHC: 33.2 g/dL (ref 31.5–35.7)
MCV: 90 fL (ref 79–97)
Platelets: 161 10*3/uL (ref 150–450)
RBC: 4.19 x10E6/uL (ref 3.77–5.28)
RDW: 12.1 % — ABNORMAL LOW (ref 12.3–15.4)
RH TYPE: POSITIVE
RPR Ser Ql: NONREACTIVE
Rubella Antibodies, IGG: 4.49 index (ref >0.99)
Varicella zoster IgG: 2322 index (ref >165)
WBC: 7.2 10*3/uL (ref 3.4–10.8)

## 2017-09-30 LAB — HEMOGLOBINOPATHY EVALUATION
HGB C: 0 %
HGB S: 0 %
HGB VARIANT: 0 %
Hemoglobin A2 Quantitation: 3 % (ref 1.8–3.2)
Hemoglobin F Quantitation: 0 % (ref 0.0–2.0)
Hgb A: 97 % (ref 96.4–98.8)

## 2017-10-02 ENCOUNTER — Encounter: Payer: Self-pay | Admitting: Advanced Practice Midwife

## 2017-10-02 ENCOUNTER — Ambulatory Visit (INDEPENDENT_AMBULATORY_CARE_PROVIDER_SITE_OTHER): Payer: Medicaid Other

## 2017-10-02 ENCOUNTER — Ambulatory Visit (INDEPENDENT_AMBULATORY_CARE_PROVIDER_SITE_OTHER): Payer: Medicaid Other | Admitting: Advanced Practice Midwife

## 2017-10-02 VITALS — BP 100/50 | Wt 202.0 lb

## 2017-10-02 DIAGNOSIS — O3411 Maternal care for benign tumor of corpus uteri, first trimester: Secondary | ICD-10-CM | POA: Diagnosis not present

## 2017-10-02 DIAGNOSIS — Z3A01 Less than 8 weeks gestation of pregnancy: Secondary | ICD-10-CM

## 2017-10-02 DIAGNOSIS — N8311 Corpus luteum cyst of right ovary: Secondary | ICD-10-CM

## 2017-10-02 DIAGNOSIS — Z3481 Encounter for supervision of other normal pregnancy, first trimester: Secondary | ICD-10-CM

## 2017-10-02 NOTE — Progress Notes (Signed)
ROB and u/s today-

## 2017-10-02 NOTE — Progress Notes (Signed)
Routine Prenatal Care Visit  Subjective  Marissa Beecher McardleDanielle Andrews is a 27 y.o. [phone] at 5241w1d being seen today for ongoing prenatal care.  She is currently monitored for the following issues for this high-risk pregnancy and has Supervision of normal pregnancy; Thrombocytopenia affecting pregnancy, antepartum (HCC); and History of stillbirth in currently pregnant patient, first trimester on their problem list.  ----------------------------------------------------------------------------------- Patient reports constipation. Encouraged increased hydration, fiber and activity. Ok to have otc stool softener.  Discussed findings of dating scan.  .  .   . Denies leaking of fluid.  ----------------------------------------------------------------------------------- The following portions of the patient's history were reviewed and updated as appropriate: allergies, current medications, past family history, past medical history, past social history, past surgical history and problem list. Problem list updated.   Objective  Blood pressure (!) 100/50, weight 202 lb (91.6 kg), last menstrual period 08/13/2017 Pregravid weight 204 lb (92.5 kg) Total Weight Gain -2 lb (-0.907 kg) Urinalysis: Urine Protein    Urine Glucose    Fetal Status: Fetal Heart Rate (bpm): 164          ULTRASOUND REPORT  Location: [facility] Date of Service: 10/02/2017   Patient Name: Marissa Andrews DOB: Apr 18, 1990 MRN: [id number]  Indications:dating Findings:  Mason JimSingleton intrauterine pregnancy is visualized with a CRL consistent with 758w4d gestation, giving an (U/S) EDD of 05/17/2018. The (U/S) EDD is consistent with the clinically established EDD of 05/20/2018.  FHR: 164 CRL measurement: 13.3 mm Yolk sac is visualized and appears normal and early anatomy is normal. Amnion: visualized and appears normal   Right Ovary is normal in appearance. Left Ovary is normal appearance. Corpus luteal cyst:  In right ovary  measuring 2.2 x 1.9 x 1.8cm Survey of the adnexa demonstrates no adnexal masses. There is no free peritoneal fluid in the cul de sac.  Small subchorionic hemorrhage measuring 1.8 x 2.6 x 1.2cm Complex cystic area seen near fetal umbilicus measuring 5 x 6 x 5mm. Question umbilical cord cyst versus other differential diagnosis.    Impression: 1. 10458w4d Viable Singleton Intrauterine pregnancy by U/S. 2. (U/S) EDD is consistent with Clinically established EDD of 05/20/2018. 3.Small subchorionic hemorrhage measuring 1.8 x 2.6 x 1.2cm 4. Complex cystic area seen near fetal umbilicus measuring 5 x 6 x 5mm  Recommendations: 1.Clinical correlation with the patient's History and Physical Exam.   Deirdre PeerAbby Clarke, RDMS RVT  General:  Alert, oriented and cooperative. Patient is in no acute distress.  Skin: Skin is warm and dry. No rash noted.   Cardiovascular: Normal heart rate noted  Respiratory: Normal respiratory effort, no problems with respiration noted  Abdomen: Soft, gravid, appropriate for gestational age.       Pelvic:  Cervical exam deferred        Extremities: Normal range of motion.     Mental Status: Normal mood and affect. Normal behavior. Normal judgment and thought content.   Assessment   27 y.o. [id number] at 6541w1d by  05/20/2018, by Last Menstrual Period presenting for routine prenatal visit  Plan   Pregnancy #5 Problems (from 08/13/17 to present)    Problem Noted Resolved   Supervision of normal pregnancy 08/13/2014 by Ward, Elenora Fenderhelsea C, MD No   Overview Signed 09/25/2017 12:40 AM by Natale MilchSchuman, Christanna R, MD      Clinic [facility] Prenatal Labs  Dating  Blood type:     Genetic Screen 1 Screen:     AFP:      Quad:      NIPS:  Antibody:   Anatomic [place]  Rubella:   Varicella: @VZVIGG @  GTT Early:        28 wk:      RPR:     Rhogam  HBsAg:     TDaP vaccine                       HIV:     Flu Shot    Declines                            GBS:   Contraception  Pap:  CBB       CS/VBAC    Baby Food    Support Person  Jess Barters               Preterm labor symptoms and general obstetric precautions including but not limited to vaginal bleeding, contractions, leaking of fluid and fetal movement were reviewed in detail with the patient.   Return in about 4 weeks (around 10/30/2017) for rob.  Tresea Mall, CNM 10/02/2017 2:08 PM

## 2017-10-30 ENCOUNTER — Ambulatory Visit (INDEPENDENT_AMBULATORY_CARE_PROVIDER_SITE_OTHER): Payer: Medicaid Other | Admitting: Obstetrics & Gynecology

## 2017-10-30 VITALS — BP 120/70 | Wt 197.0 lb

## 2017-10-30 DIAGNOSIS — Z3481 Encounter for supervision of other normal pregnancy, first trimester: Secondary | ICD-10-CM

## 2017-10-30 DIAGNOSIS — Z23 Encounter for immunization: Secondary | ICD-10-CM

## 2017-10-30 DIAGNOSIS — Z1379 Encounter for other screening for genetic and chromosomal anomalies: Secondary | ICD-10-CM

## 2017-10-30 DIAGNOSIS — Z3A11 11 weeks gestation of pregnancy: Secondary | ICD-10-CM

## 2017-10-30 LAB — POCT URINALYSIS DIPSTICK OB
GLUCOSE, UA: NEGATIVE
POC,PROTEIN,UA: NEGATIVE

## 2017-10-30 MED ORDER — PROVIDA OB 20-20-1.25 MG PO CAPS: 1.0000 | ORAL_CAPSULE | Freq: Every day | ORAL | 11 refills | Status: AC

## 2017-10-30 NOTE — Progress Notes (Signed)
  Subjective  Fetal Movement? no Contractions? no Leaking Fluid? no Vaginal Bleeding? no Nausea? no Objective  BP 120/70   Wt 197 lb (89.4 kg)   LMP 08/13/2017   BMI 32.78 kg/m  General: NAD Pumonary: no increased work of breathing Abdomen: gravid, non-tender Extremities: no edema Psychiatric: mood appropriate, affect full  Assessment  27 y.o. [id number] at [redacted]w[redacted]d by  05/20/2018, by Last Menstrual Period presenting for routine prenatal visit  Plan   Problem List Items Addressed This Visit      Other   Supervision of normal pregnancy    Other Visit Diagnoses    [redacted] weeks gestation of pregnancy    -  Primary   Relevant Orders   POC Urinalysis Dipstick OB (Completed)   Need for immunization against influenza       Relevant Orders   Flu Vaccine QUAD 36+ mos IM (Completed)   Encounter for genetic screening for Down Syndrome       Relevant Orders   MaterniT21 PLUS Core+SCA    f/u w lab results PNV Rx done  Annamarie Major, MD, [facility], [facility] 10/30/2017  11:15 AM

## 2017-10-30 NOTE — Patient Instructions (Signed)

## 2017-11-06 ENCOUNTER — Telehealth: Payer: Self-pay

## 2017-11-06 LAB — MATERNIT21 PLUS CORE+SCA
CHROMOSOME 13: NEGATIVE
CHROMOSOME 18: NEGATIVE
CHROMOSOME 21: NEGATIVE
Y Chromosome: DETECTED

## 2017-11-06 NOTE — Telephone Encounter (Signed)
Pt came in today to get Genetic test results they are not back yet but she would like to have an envelope with the gender inside. Thank you!

## 2017-11-09 NOTE — Telephone Encounter (Signed)
Pt had previous asked for results to be put in an envelope. However, she is now requesting that she be called with results. Cb#[phone]

## 2017-11-27 ENCOUNTER — Encounter: Payer: Medicaid Other | Admitting: Maternal Newborn

## 2017-11-30 ENCOUNTER — Encounter: Payer: Medicaid Other | Admitting: Advanced Practice Midwife

## 2017-12-04 ENCOUNTER — Encounter: Payer: Self-pay | Admitting: Advanced Practice Midwife

## 2017-12-04 ENCOUNTER — Ambulatory Visit (INDEPENDENT_AMBULATORY_CARE_PROVIDER_SITE_OTHER): Payer: Medicaid Other | Admitting: Advanced Practice Midwife

## 2017-12-04 VITALS — BP 98/60 | Wt 194.0 lb

## 2017-12-04 DIAGNOSIS — Z3482 Encounter for supervision of other normal pregnancy, second trimester: Secondary | ICD-10-CM

## 2017-12-04 DIAGNOSIS — Z3A16 16 weeks gestation of pregnancy: Secondary | ICD-10-CM

## 2017-12-04 DIAGNOSIS — Z3480 Encounter for supervision of other normal pregnancy, unspecified trimester: Secondary | ICD-10-CM

## 2017-12-04 LAB — POCT URINALYSIS DIPSTICK OB: GLUCOSE, UA: NEGATIVE

## 2017-12-04 NOTE — Progress Notes (Signed)
ROB- has been having pelvic pain x one week, has been having trouble falling asleep

## 2017-12-04 NOTE — Progress Notes (Signed)
  Routine Prenatal Care Visit  Subjective  Marissa Andrews is a 27 y.o. Z6X0960G5P2112 at 5342w1d being seen today for ongoing prenatal care.  She is currently monitored for the following issues for this high-risk pregnancy and has Supervision of normal pregnancy; Thrombocytopenia affecting pregnancy, antepartum (HCC); and History of stillbirth in currently pregnant patient, first trimester on their problem list.  ----------------------------------------------------------------------------------- Patient reports some round ligament pain. Reviewed comfort measures.   Contractions: Not present. Vag. Bleeding: None.  Movement: Absent. Denies leaking of fluid.  ----------------------------------------------------------------------------------- The following portions of the patient's history were reviewed and updated as appropriate: allergies, current medications, past family history, past medical history, past social history, past surgical history and problem list. Problem list updated.   Objective  Blood pressure 98/60, weight 194 lb (88 kg), last menstrual period 08/13/2017, unknown if currently breastfeeding. Pregravid weight 204 lb (92.5 kg) Total Weight Gain -10 lb (-4.536 kg) Urinalysis: Urine Protein Trace  Urine Glucose Negative  Fetal Status: Fetal Heart Rate (bpm): 151   Movement: Absent     General:  Alert, oriented and cooperative. Patient is in no acute distress.  Skin: Skin is warm and dry. No rash noted.   Cardiovascular: Normal heart rate noted  Respiratory: Normal respiratory effort, no problems with respiration noted  Abdomen: Soft, gravid, appropriate for gestational age. Pain/Pressure: Absent     Pelvic:  Cervical exam deferred        Extremities: Normal range of motion.     Mental Status: Normal mood and affect. Normal behavior. Normal judgment and thought content.   Assessment   27 y.o. A5W0981G5P2112 at 7842w1d by  05/20/2018, by Last Menstrual Period presenting for routine  prenatal visit  Plan   Pregnancy #5 Problems (from 08/13/17 to present)    Problem Noted Resolved   Supervision of normal pregnancy 08/13/2014 by Ward, Elenora Fenderhelsea C, MD No   Overview Signed 09/25/2017 12:40 AM by Natale MilchSchuman, Christanna R, MD      [facility]inic [facility] Prenatal Labs  Dating  Blood type:     Genetic Screen 1 Screen:     AFP:      Quad:      NIPS:    Antibody:   Anatomic US  Rubella:   Varicella: @VZVIGG @  GTT Early:        28 wk:      RPR:     Rhogam  HBsAg:     TDaP vaccine                       HIV:     Flu Shot    Declines                            GBS:   Contraception  Pap:  CBB     CS/VBAC    Baby Food    Support Person  Jess BartersXavier               Preterm labor symptoms and general obstetric precautions including but not limited to vaginal bleeding, contractions, leaking of fluid and fetal movement were reviewed in detail with the patient.    Return in about 4 weeks (around 01/01/2018) for anatomy scan and rob.  [name] Blondine Hottel, CNM 12/04/2017 11:33 AM

## 2017-12-31 ENCOUNTER — Ambulatory Visit (INDEPENDENT_AMBULATORY_CARE_PROVIDER_SITE_OTHER): Payer: Medicaid Other

## 2017-12-31 ENCOUNTER — Encounter: Payer: Self-pay | Admitting: Obstetrics and Gynecology

## 2017-12-31 ENCOUNTER — Ambulatory Visit (INDEPENDENT_AMBULATORY_CARE_PROVIDER_SITE_OTHER): Payer: Medicaid Other | Admitting: Obstetrics and Gynecology

## 2017-12-31 VITALS — BP 110/62 | Wt 197.0 lb

## 2017-12-31 DIAGNOSIS — O09291 Supervision of pregnancy with other poor reproductive or obstetric history, first trimester: Secondary | ICD-10-CM

## 2017-12-31 DIAGNOSIS — Z3482 Encounter for supervision of other normal pregnancy, second trimester: Secondary | ICD-10-CM

## 2017-12-31 DIAGNOSIS — Z3480 Encounter for supervision of other normal pregnancy, unspecified trimester: Secondary | ICD-10-CM

## 2017-12-31 DIAGNOSIS — O99119 Other diseases of the blood and blood-forming organs and certain disorders involving the immune mechanism complicating pregnancy, unspecified trimester: Secondary | ICD-10-CM

## 2017-12-31 DIAGNOSIS — O99112 Other diseases of the blood and blood-forming organs and certain disorders involving the immune mechanism complicating pregnancy, second trimester: Secondary | ICD-10-CM

## 2017-12-31 DIAGNOSIS — Z363 Encounter for antenatal screening for malformations: Secondary | ICD-10-CM | POA: Diagnosis not present

## 2017-12-31 DIAGNOSIS — D696 Thrombocytopenia, unspecified: Secondary | ICD-10-CM

## 2017-12-31 DIAGNOSIS — Z3A20 20 weeks gestation of pregnancy: Secondary | ICD-10-CM

## 2017-12-31 DIAGNOSIS — O09292 Supervision of pregnancy with other poor reproductive or obstetric history, second trimester: Secondary | ICD-10-CM

## 2017-12-31 NOTE — Progress Notes (Signed)
Routine Prenatal Care Visit  Subjective  Marissa Andrews is a 27 y.o. Z6X0960G5P2112 at 353w0d being seen today for ongoing prenatal care.  She is currently monitored for the following issues for this low-risk pregnancy and has Supervision of normal pregnancy; Thrombocytopenia affecting pregnancy, antepartum (HCC); and History of stillbirth in currently pregnant patient, first trimester on their problem list.  ----------------------------------------------------------------------------------- Patient reports no complaints.   Contractions: Not present. Vag. Bleeding: None.  Movement: Present. Denies leaking of fluid.  Anatomy u/s incomplete today.  Did not see cyst on umbilical cord.  ----------------------------------------------------------------------------------- The following portions of the patient's history were reviewed and updated as appropriate: allergies, current medications, past family history, past medical history, past social history, past surgical history and problem list. Problem list updated.   Objective  Blood pressure 110/62, weight 197 lb (89.4 kg), last menstrual period 08/13/2017, unknown if currently breastfeeding. Pregravid weight 204 lb (92.5 kg) Total Weight Gain -7 lb (-3.175 kg) Urinalysis: Urine Protein    Urine Glucose    Fetal Status: Fetal Heart Rate (bpm): Present   Movement: Present     General:  Alert, oriented and cooperative. Patient is in no acute distress.  Skin: Skin is warm and dry. No rash noted.   Cardiovascular: Normal heart rate noted  Respiratory: Normal respiratory effort, no problems with respiration noted  Abdomen: Soft, gravid, appropriate for gestational age. Pain/Pressure: Absent     Pelvic:  Cervical exam deferred        Extremities: Normal range of motion.     Mental Status: Normal mood and affect. Normal behavior. Normal judgment and thought content.   Imaging Results: [place] Ob Comp + 14 Wk  Result Date: 12/31/2017 Patient Name:  Marissa Andrews DOB: [birth year] MRN: [id number] ULTRASOUND REPORT Location: [facility] Date of Service: 12/31/2017 Indications:Anatomy Ultrasound Findings: Mason JimSingleton intrauterine pregnancy is visualized with FHR at 143 BPM. Biometrics give an (U/S) Gestational age of 576w5d and an (U/S) EDD of 05/15/2018; this correlates with the clinically established Estimated Date of Delivery: 05/20/18 Fetal presentation is Cephalic. EFW: 368g (13oz). Placenta: anterior. Grade: 0 AFI: subjectively normal. Anatomic survey is incomplete for nose/lips, profile, and cardiac views; Gender - female.  Right Ovary is normal in appearance. Left Ovary is normal appearance. Survey of the adnexa demonstrates no adnexal masses. There is no free peritoneal fluid in the cul de sac. Impression: 1. 3353w0d Viable Singleton Intrauterine pregnancy by U/S. 2. (U/S) EDD is consistent with Clinically established Estimated Date of Delivery: 05/20/18 . Recommendations: 1.Clinical correlation with the patient's History and Physical Exam. 2. Follow up nose/lips, profile and cardiac views. Darlina GuysAbby M Clarke, RDMS RVT There is a singleton gestation with subjectively normal amniotic fluid volume. The fetal biometry correlates with established dating. Detailed evaluation of the fetal anatomy was performed.The fetal anatomical survey appears within normal limits within the resolution of ultrasound as described above.  Not all structures were able to be visualized on today's study.  It is recommended that a follow-up ultrasound be performed to complete visualization of the unobserved structures today.  It must be noted that a normal ultrasound is unable to rule out fetal aneuploidy nor is it able to detect all possible malformations.    The ultrasound images and findings were reviewed by me and I agree with the above report. Thomasene MohairStephen Kaelon Weekes, MD, [facility] FrederickFACOG [facility], [facility] 12/31/2017 10:24 AM      Assessment   27 y.o. [name] at 7353w0d by   05/20/2018, by Last  Menstrual Period presenting for routine prenatal visit  Plan   Pregnancy #5 Problems (from 08/13/17 to present)    Problem Noted Resolved   Supervision of normal pregnancy 08/13/2014 by Ward, Elenora Fenderhelsea C, MD No   Overview Signed 09/25/2017 12:40 AM by Natale MilchSchuman, Christanna R, MD      Clinic [facility] Prenatal Labs  Dating  Blood type:     Genetic Screen 1 Screen:     AFP:      Quad:      NIPS:    Antibody:   Anatomic US  Rubella:   Varicella: @VZVIGG @  GTT Early:        28 wk:      RPR:     Rhogam  HBsAg:     TDaP vaccine                       HIV:     Flu Shot    Declines                            GBS:   Contraception  Pap:  CBB     CS/VBAC    Baby Food    Support Person  Jess BartersXavier            Preterm labor symptoms and general obstetric precautions including but not limited to vaginal bleeding, contractions, leaking of fluid and fetal movement were reviewed in detail with the patient. Please refer to After Visit Summary for other counseling recommendations.   Return in about 4 weeks (around 01/28/2018) for Completion anatomy ultrasound and routine prenatal.  Thomasene MohairStephen Zachrey Deutscher, MD, [facility] FrederickFACOG [facility], [facility] 12/31/2017 10:31 AM

## 2018-01-28 ENCOUNTER — Encounter: Payer: Medicaid Other | Admitting: Maternal Newborn

## 2018-01-28 ENCOUNTER — Ambulatory Visit (INDEPENDENT_AMBULATORY_CARE_PROVIDER_SITE_OTHER): Payer: Medicaid Other

## 2018-01-28 ENCOUNTER — Ambulatory Visit (INDEPENDENT_AMBULATORY_CARE_PROVIDER_SITE_OTHER): Payer: Medicaid Other | Admitting: Advanced Practice Midwife

## 2018-01-28 ENCOUNTER — Encounter: Payer: Self-pay | Admitting: Advanced Practice Midwife

## 2018-01-28 VITALS — BP 118/74 | Wt 200.0 lb

## 2018-01-28 DIAGNOSIS — Z131 Encounter for screening for diabetes mellitus: Secondary | ICD-10-CM

## 2018-01-28 DIAGNOSIS — Z3480 Encounter for supervision of other normal pregnancy, unspecified trimester: Secondary | ICD-10-CM

## 2018-01-28 DIAGNOSIS — Z3A20 20 weeks gestation of pregnancy: Secondary | ICD-10-CM

## 2018-01-28 DIAGNOSIS — Z362 Encounter for other antenatal screening follow-up: Secondary | ICD-10-CM | POA: Diagnosis not present

## 2018-01-28 DIAGNOSIS — Z130 Encounter for screening for diseases of the blood and blood-forming organs and certain disorders involving the immune mechanism: Secondary | ICD-10-CM

## 2018-01-28 DIAGNOSIS — Z3482 Encounter for supervision of other normal pregnancy, second trimester: Secondary | ICD-10-CM

## 2018-01-28 DIAGNOSIS — Z3A24 24 weeks gestation of pregnancy: Secondary | ICD-10-CM

## 2018-01-28 DIAGNOSIS — Z113 Encounter for screening for infections with a predominantly sexual mode of transmission: Secondary | ICD-10-CM

## 2018-01-28 NOTE — Progress Notes (Signed)
Routine Prenatal Care Visit  Subjective  Marissa Andrews is a 28 y.o. [id number] at 7726w0d being seen today for ongoing prenatal care.  She is currently monitored for the following issues for this low-risk pregnancy and has Supervision of normal pregnancy; Thrombocytopenia affecting pregnancy, antepartum (HCC); and History of stillbirth in currently pregnant patient, first trimester on their problem list.  ----------------------------------------------------------------------------------- Patient reports no complaints. She admits not liking or drinking enough water. Discussed importance of adequate hydration during pregnancy. Reviewed results of today's ultrasound.  Contractions: Not present. Vag. Bleeding: None.  Movement: Present. Denies leaking of fluid.  ----------------------------------------------------------------------------------- The following portions of the patient's history were reviewed and updated as appropriate: allergies, current medications, past family history, past medical history, past social history, past surgical history and problem list. Problem list updated.   Objective  Blood pressure 118/74, weight 200 lb (90.7 kg), last menstrual period 08/13/2017 Pregravid weight 204 lb (92.5 kg) Total Weight Gain -4 lb (-1.814 kg) Urinalysis: Urine Protein    Urine Glucose    Fetal Status: Fetal Heart Rate (bpm): 139   Movement: Present      ULTRASOUND REPORT  Location: [facility] Date of Service: 01/28/2018   Patient Name: Marissa Andrews DOB: 1990/10/17 MRN: [id number]  Indications: Anatomy follow up ultrasound Findings:  Mason JimSingleton intrauterine pregnancy is visualized with FHR at 139 BPM.  Fetal presentation is Cephalic.  Placenta: anterior. Grade: 0, Possible retroplacental hematoma continuous  in placenta is seen measuring 1.9 x 2.2 cm AFI: subjectively normal.  Anatomic survey is complete.   There is no free peritoneal fluid in the cul de  sac.  Impression: 1. 6926w0d Viable Singleton Intrauterine pregnancy previously established criteria. 2. Normal Anatomy Scan is now complete 3. Possible retroplacental hematoma continuous  in placenta is seen measuring 1.9 x 2.2 cm  Recommendations: 1.Clinical correlation with the patient's History and Physical Exam.  Mital bahen Leodis BinetP Patel, RDMS  General:  Alert, oriented and cooperative. Patient is in no acute distress.  Skin: Skin is warm and dry. No rash noted.   Cardiovascular: Normal heart rate noted  Respiratory: Normal respiratory effort, no problems with respiration noted  Abdomen: Soft, gravid, appropriate for gestational age. Pain/Pressure: Absent     Pelvic:  Cervical exam deferred        Extremities: Normal range of motion.  Edema: None  Mental Status: Normal mood and affect. Normal behavior. Normal judgment and thought content.   Assessment   28 y.o. [name] at 5526w0d by  05/20/2018, by Last Menstrual Period presenting for routine prenatal visit  Plan   Pregnancy #5 Problems (from 08/13/17 to present)    Problem Noted Resolved   Supervision of normal pregnancy 08/13/2014 by Ward, Elenora Fenderhelsea C, MD No   Overview Signed 09/25/2017 12:40 AM by Natale MilchSchuman, Christanna R, MD      Clinic [facility] Prenatal Labs  Dating  Blood type:     Genetic Screen 1 Screen:     AFP:      Quad:      NIPS:    Antibody:   Anatomic US  Rubella:   Varicella: @VZVIGG @  GTT Early:        28 wk:      RPR:     Rhogam  HBsAg:     TDaP vaccine                       HIV:     Flu Shot    Declines  GBS:   Contraception  Pap:  CBB     CS/VBAC    Baby Food    Support Person  Jess BartersXavier               Preterm labor symptoms and general obstetric precautions including but not limited to vaginal bleeding, contractions, leaking of fluid and fetal movement were reviewed in detail with the patient.   Return in about 4 weeks (around 02/25/2018) for 28 wk labs and rob.  Marissa Andrews  Draycen Leichter, CNM 01/28/2018 10:58 AM

## 2018-01-28 NOTE — Progress Notes (Signed)
U/s today. No vb. No lof.  

## 2018-02-25 ENCOUNTER — Ambulatory Visit (INDEPENDENT_AMBULATORY_CARE_PROVIDER_SITE_OTHER): Payer: Medicaid Other | Admitting: Obstetrics & Gynecology

## 2018-02-25 ENCOUNTER — Encounter: Payer: Medicaid Other | Admitting: Advanced Practice Midwife

## 2018-02-25 ENCOUNTER — Other Ambulatory Visit: Payer: Medicaid Other

## 2018-02-25 VITALS — BP 100/70 | Wt 198.0 lb

## 2018-02-25 DIAGNOSIS — Z3480 Encounter for supervision of other normal pregnancy, unspecified trimester: Secondary | ICD-10-CM

## 2018-02-25 DIAGNOSIS — Z3483 Encounter for supervision of other normal pregnancy, third trimester: Secondary | ICD-10-CM

## 2018-02-25 DIAGNOSIS — Z3A28 28 weeks gestation of pregnancy: Secondary | ICD-10-CM

## 2018-02-25 NOTE — Progress Notes (Signed)
  Subjective  Fetal Movement? yes Contractions? no Leaking Fluid? no Vaginal Bleeding? yes  Objective  BP 100/70   Wt 198 lb (89.8 kg)   LMP 08/13/2017   BMI 32.95 kg/m  General: NAD Pumonary: no increased work of breathing Abdomen: gravid, non-tender Extremities: no edema Psychiatric: mood appropriate, affect full  Assessment  28 y.o. [id number] at [redacted]w[redacted]d by  05/20/2018, by Last Menstrual Period presenting for routine prenatal visit  Plan   Problem List Items Addressed This Visit      Other   Supervision of normal pregnancy    Other Visit Diagnoses    [redacted] weeks gestation of pregnancy    -  Primary    Labs nv as had too much sugar this am [place] nv to recheck placenta PNV, [facility] Plans IUD  Annamarie Major, MD, [facility], [facility] 02/25/2018  11:06 AM

## 2018-02-25 NOTE — Patient Instructions (Signed)
Third Trimester of Pregnancy The third trimester is from week 28 through week 40 (months 7 through 9). The third trimester is a time when the unborn baby (fetus) is growing rapidly. At the end of the ninth month, the fetus is about 20 inches in length and weighs 6-10 pounds. Body changes during your third trimester Your body will continue to go through many changes during pregnancy. The changes vary from woman to woman. During the third trimester:  Your weight will continue to increase. You can expect to gain 25-35 pounds (11-16 kg) by the end of the pregnancy.  You may begin to get stretch marks on your hips, abdomen, and breasts.  You may urinate more often because the fetus is moving lower into your pelvis and pressing on your bladder.  You may develop or continue to have heartburn. This is caused by increased hormones that slow down muscles in the digestive tract.  You may develop or continue to have constipation because increased hormones slow digestion and cause the muscles that push waste through your intestines to relax.  You may develop hemorrhoids. These are swollen veins (varicose veins) in the rectum that can itch or be painful.  You may develop swollen, bulging veins (varicose veins) in your legs.  You may have increased body aches in the pelvis, back, or thighs. This is due to weight gain and increased hormones that are relaxing your joints.  You may have changes in your hair. These can include thickening of your hair, rapid growth, and changes in texture. Some women also have hair loss during or after pregnancy, or hair that feels dry or thin. Your hair will most likely return to normal after your baby is born.  Your breasts will continue to grow and they will continue to become tender. A yellow fluid (colostrum) may leak from your breasts. This is the first milk you are producing for your baby.  Your belly button may stick out.  You may notice more swelling in your hands,  face, or ankles.  You may have increased tingling or numbness in your hands, arms, and legs. The skin on your belly may also feel numb.  You may feel short of breath because of your expanding uterus.  You may have more problems sleeping. This can be caused by the size of your belly, increased need to urinate, and an increase in your body's metabolism.  You may notice the fetus "dropping," or moving lower in your abdomen (lightening).  You may have increased vaginal discharge.  You may notice your joints feel loose and you may have pain around your pelvic bone. What to expect at prenatal visits You will have prenatal exams every 2 weeks until week 36. Then you will have weekly prenatal exams. During a routine prenatal visit:  You will be weighed to make sure you and the baby are growing normally.  Your blood pressure will be taken.  Your abdomen will be measured to track your baby's growth.  The fetal heartbeat will be listened to.  Any test results from the previous visit will be discussed.  You may have a cervical check near your due date to see if your cervix has softened or thinned (effaced).  You will be tested for Group B streptococcus. This happens between 35 and 37 weeks. Your health care provider may ask you:  What your birth plan is.  How you are feeling.  If you are feeling the baby move.  If you have had any abnormal   symptoms, such as leaking fluid, bleeding, severe headaches, or abdominal cramping.  If you are using any tobacco products, including cigarettes, chewing tobacco, and electronic cigarettes.  If you have any questions. Other tests or screenings that may be performed during your third trimester include:  Blood tests that check for low iron levels (anemia).  Fetal testing to check the health, activity level, and growth of the fetus. Testing is done if you have certain medical conditions or if there are problems during the pregnancy.  Nonstress test  (NST). This test checks the health of your baby to make sure there are no signs of problems, such as the baby not getting enough oxygen. During this test, a belt is placed around your belly. The baby is made to move, and its heart rate is monitored during movement. What is false labor? False labor is a condition in which you feel small, irregular tightenings of the muscles in the womb (contractions) that usually go away with rest, changing position, or drinking water. These are called Braxton Hicks contractions. Contractions may last for hours, days, or even weeks before true labor sets in. If contractions come at regular intervals, become more frequent, increase in intensity, or become painful, you should see your health care provider. What are the signs of labor?  Abdominal cramps.  Regular contractions that start at 10 minutes apart and become stronger and more frequent with time.  Contractions that start on the top of the uterus and spread down to the lower abdomen and back.  Increased pelvic pressure and dull back pain.  A watery or bloody mucus discharge that comes from the vagina.  Leaking of amniotic fluid. This is also known as your "water breaking." It could be a slow trickle or a gush. Let your health care provider know if it has a color or strange odor. If you have any of these signs, call your health care provider right away, even if it is before your due date. Follow these instructions at home: Medicines  Follow your health care provider's instructions regarding medicine use. Specific medicines may be either safe or unsafe to take during pregnancy.  Take a prenatal vitamin that contains at least 600 micrograms (mcg) of folic acid.  If you develop constipation, try taking a stool softener if your health care provider approves. Eating and drinking   Eat a balanced diet that includes fresh fruits and vegetables, whole grains, good sources of protein such as meat, eggs, or tofu,  and low-fat dairy. Your health care provider will help you determine the amount of weight gain that is right for you.  Avoid raw meat and uncooked cheese. These carry germs that can cause birth defects in the baby.  If you have low calcium intake from food, talk to your health care provider about whether you should take a daily calcium supplement.  Eat four or five small meals rather than three large meals a day.  Limit foods that are high in fat and processed sugars, such as fried and sweet foods.  To prevent constipation: ? Drink enough fluid to keep your urine clear or pale yellow. ? Eat foods that are high in fiber, such as fresh fruits and vegetables, whole grains, and beans. Activity  Exercise only as directed by your health care provider. Most women can continue their usual exercise routine during pregnancy. Try to exercise for 30 minutes at least 5 days a week. Stop exercising if you experience uterine contractions.  Avoid heavy lifting.  Do   not exercise in extreme heat or humidity, or at high altitudes.  Wear low-heel, comfortable shoes.  Practice good posture.  You may continue to have sex unless your health care provider tells you otherwise. Relieving pain and discomfort  Take frequent breaks and rest with your legs elevated if you have leg cramps or low back pain.  Take warm sitz baths to soothe any pain or discomfort caused by hemorrhoids. Use hemorrhoid cream if your health care provider approves.  Wear a good support bra to prevent discomfort from breast tenderness.  If you develop varicose veins: ? Wear support pantyhose or compression stockings as told by your healthcare provider. ? Elevate your feet for 15 minutes, 3-4 times a day. Prenatal care  Write down your questions. Take them to your prenatal visits.  Keep all your prenatal visits as told by your health care provider. This is important. Safety  Wear your seat belt at all times when driving.  Make  a list of emergency phone numbers, including numbers for family, friends, the hospital, and police and fire departments. General instructions  Avoid cat litter boxes and soil used by cats. These carry germs that can cause birth defects in the baby. If you have a cat, ask someone to clean the litter box for you.  Do not travel far distances unless it is absolutely necessary and only with the approval of your health care provider.  Do not use hot tubs, steam rooms, or saunas.  Do not drink alcohol.  Do not use any products that contain nicotine or tobacco, such as cigarettes and e-cigarettes. If you need help quitting, ask your health care provider.  Do not use any medicinal herbs or unprescribed drugs. These chemicals affect the formation and growth of the baby.  Do not douche or use tampons or scented sanitary pads.  Do not cross your legs for long periods of time.  To prepare for the arrival of your baby: ? Take prenatal classes to understand, practice, and ask questions about labor and delivery. ? Make a trial run to the hospital. ? Visit the hospital and tour the maternity area. ? Arrange for maternity or paternity leave through employers. ? Arrange for family and friends to take care of pets while you are in the hospital. ? Purchase a rear-facing car seat and make sure you know how to install it in your car. ? Pack your hospital bag. ? Prepare the baby's nursery. Make sure to remove all pillows and stuffed animals from the baby's crib to prevent suffocation.  Visit your dentist if you have not gone during your pregnancy. Use a soft toothbrush to brush your teeth and be gentle when you floss. Contact a health care provider if:  You are unsure if you are in labor or if your water has broken.  You become dizzy.  You have mild pelvic cramps, pelvic pressure, or nagging pain in your abdominal area.  You have lower back pain.  You have persistent nausea, vomiting, or  diarrhea.  You have an unusual or bad smelling vaginal discharge.  You have pain when you urinate. Get help right away if:  Your water breaks before 37 weeks.  You have regular contractions less than 5 minutes apart before 37 weeks.  You have a fever.  You are leaking fluid from your vagina.  You have spotting or bleeding from your vagina.  You have severe abdominal pain or cramping.  You have rapid weight loss or weight gain.  You have   shortness of breath with chest pain.  You notice sudden or extreme swelling of your face, hands, ankles, feet, or legs.  Your baby makes fewer than 10 movements in 2 hours.  You have severe headaches that do not go away when you take medicine.  You have vision changes. Summary  The third trimester is from week 28 through week 40, months 7 through 9. The third trimester is a time when the unborn baby (fetus) is growing rapidly.  During the third trimester, your discomfort may increase as you and your baby continue to gain weight. You may have abdominal, leg, and back pain, sleeping problems, and an increased need to urinate.  During the third trimester your breasts will keep growing and they will continue to become tender. A yellow fluid (colostrum) may leak from your breasts. This is the first milk you are producing for your baby.  False labor is a condition in which you feel small, irregular tightenings of the muscles in the womb (contractions) that eventually go away. These are called Braxton Hicks contractions. Contractions may last for hours, days, or even weeks before true labor sets in.  Signs of labor can include: abdominal cramps; regular contractions that start at 10 minutes apart and become stronger and more frequent with time; watery or bloody mucus discharge that comes from the vagina; increased pelvic pressure and dull back pain; and leaking of amniotic fluid. This information is not intended to replace advice given to you by your  health care provider. Make sure you discuss any questions you have with your health care provider. Document Released: 12/24/2000 Document Revised: 02/05/2016 Document Reviewed: 02/05/2016 Elsevier Interactive Patient Education  2019 [employer]  

## 2018-03-10 ENCOUNTER — Other Ambulatory Visit: Payer: Self-pay | Admitting: Obstetrics & Gynecology

## 2018-03-10 DIAGNOSIS — O468X3 Other antepartum hemorrhage, third trimester: Principal | ICD-10-CM

## 2018-03-10 DIAGNOSIS — O418X30 Other specified disorders of amniotic fluid and membranes, third trimester, not applicable or unspecified: Secondary | ICD-10-CM

## 2018-03-11 ENCOUNTER — Other Ambulatory Visit: Payer: Medicaid Other

## 2018-03-11 ENCOUNTER — Encounter: Payer: Medicaid Other | Admitting: Obstetrics & Gynecology

## 2018-03-25 ENCOUNTER — Other Ambulatory Visit: Payer: Medicaid Other

## 2018-03-25 ENCOUNTER — Other Ambulatory Visit: Payer: Self-pay

## 2018-03-25 ENCOUNTER — Ambulatory Visit (INDEPENDENT_AMBULATORY_CARE_PROVIDER_SITE_OTHER): Payer: Medicaid Other

## 2018-03-25 ENCOUNTER — Ambulatory Visit (INDEPENDENT_AMBULATORY_CARE_PROVIDER_SITE_OTHER): Payer: Medicaid Other | Admitting: Maternal Newborn

## 2018-03-25 ENCOUNTER — Encounter: Payer: Self-pay | Admitting: Maternal Newborn

## 2018-03-25 VITALS — BP 102/50 | Wt 195.0 lb

## 2018-03-25 DIAGNOSIS — O418X30 Other specified disorders of amniotic fluid and membranes, third trimester, not applicable or unspecified: Secondary | ICD-10-CM

## 2018-03-25 DIAGNOSIS — Z3480 Encounter for supervision of other normal pregnancy, unspecified trimester: Secondary | ICD-10-CM

## 2018-03-25 DIAGNOSIS — Z3A32 32 weeks gestation of pregnancy: Secondary | ICD-10-CM

## 2018-03-25 DIAGNOSIS — O468X3 Other antepartum hemorrhage, third trimester: Secondary | ICD-10-CM | POA: Diagnosis not present

## 2018-03-25 DIAGNOSIS — Z3483 Encounter for supervision of other normal pregnancy, third trimester: Secondary | ICD-10-CM

## 2018-03-25 DIAGNOSIS — Z23 Encounter for immunization: Secondary | ICD-10-CM

## 2018-03-25 NOTE — Patient Instructions (Signed)
Third Trimester of Pregnancy The third trimester is from week 28 through week 40 (months 7 through 9). The third trimester is a time when the unborn baby (fetus) is growing rapidly. At the end of the ninth month, the fetus is about 20 inches in length and weighs 6-10 pounds. Body changes during your third trimester Your body will continue to go through many changes during pregnancy. The changes vary from woman to woman. During the third trimester:  Your weight will continue to increase. You can expect to gain 25-35 pounds (11-16 kg) by the end of the pregnancy.  You may begin to get stretch marks on your hips, abdomen, and breasts.  You may urinate more often because the fetus is moving lower into your pelvis and pressing on your bladder.  You may develop or continue to have heartburn. This is caused by increased hormones that slow down muscles in the digestive tract.  You may develop or continue to have constipation because increased hormones slow digestion and cause the muscles that push waste through your intestines to relax.  You may develop hemorrhoids. These are swollen veins (varicose veins) in the rectum that can itch or be painful.  You may develop swollen, bulging veins (varicose veins) in your legs.  You may have increased body aches in the pelvis, back, or thighs. This is due to weight gain and increased hormones that are relaxing your joints.  You may have changes in your hair. These can include thickening of your hair, rapid growth, and changes in texture. Some women also have hair loss during or after pregnancy, or hair that feels dry or thin. Your hair will most likely return to normal after your baby is born.  Your breasts will continue to grow and they will continue to become tender. A yellow fluid (colostrum) may leak from your breasts. This is the first milk you are producing for your baby.  Your belly button may stick out.  You may notice more swelling in your hands,  face, or ankles.  You may have increased tingling or numbness in your hands, arms, and legs. The skin on your belly may also feel numb.  You may feel short of breath because of your expanding uterus.  You may have more problems sleeping. This can be caused by the size of your belly, increased need to urinate, and an increase in your body's metabolism.  You may notice the fetus "dropping," or moving lower in your abdomen (lightening).  You may have increased vaginal discharge.  You may notice your joints feel loose and you may have pain around your pelvic bone. What to expect at prenatal visits You will have prenatal exams every 2 weeks until week 36. Then you will have weekly prenatal exams. During a routine prenatal visit:  You will be weighed to make sure you and the baby are growing normally.  Your blood pressure will be taken.  Your abdomen will be measured to track your baby's growth.  The fetal heartbeat will be listened to.  Any test results from the previous visit will be discussed.  You may have a cervical check near your due date to see if your cervix has softened or thinned (effaced).  You will be tested for Group B streptococcus. This happens between 35 and 37 weeks. Your health care provider may ask you:  What your birth plan is.  How you are feeling.  If you are feeling the baby move.  If you have had any abnormal   symptoms, such as leaking fluid, bleeding, severe headaches, or abdominal cramping.  If you are using any tobacco products, including cigarettes, chewing tobacco, and electronic cigarettes.  If you have any questions. Other tests or screenings that may be performed during your third trimester include:  Blood tests that check for low iron levels (anemia).  Fetal testing to check the health, activity level, and growth of the fetus. Testing is done if you have certain medical conditions or if there are problems during the pregnancy.  Nonstress test  (NST). This test checks the health of your baby to make sure there are no signs of problems, such as the baby not getting enough oxygen. During this test, a belt is placed around your belly. The baby is made to move, and its heart rate is monitored during movement. What is false labor? False labor is a condition in which you feel small, irregular tightenings of the muscles in the womb (contractions) that usually go away with rest, changing position, or drinking water. These are called Braxton Hicks contractions. Contractions may last for hours, days, or even weeks before true labor sets in. If contractions come at regular intervals, become more frequent, increase in intensity, or become painful, you should see your health care provider. What are the signs of labor?  Abdominal cramps.  Regular contractions that start at 10 minutes apart and become stronger and more frequent with time.  Contractions that start on the top of the uterus and spread down to the lower abdomen and back.  Increased pelvic pressure and dull back pain.  A watery or bloody mucus discharge that comes from the vagina.  Leaking of amniotic fluid. This is also known as your "water breaking." It could be a slow trickle or a gush. Let your health care provider know if it has a color or strange odor. If you have any of these signs, call your health care provider right away, even if it is before your due date. Follow these instructions at home: Medicines  Follow your health care provider's instructions regarding medicine use. Specific medicines may be either safe or unsafe to take during pregnancy.  Take a prenatal vitamin that contains at least 600 micrograms (mcg) of folic acid.  If you develop constipation, try taking a stool softener if your health care provider approves. Eating and drinking   Eat a balanced diet that includes fresh fruits and vegetables, whole grains, good sources of protein such as meat, eggs, or tofu,  and low-fat dairy. Your health care provider will help you determine the amount of weight gain that is right for you.  Avoid raw meat and uncooked cheese. These carry germs that can cause birth defects in the baby.  If you have low calcium intake from food, talk to your health care provider about whether you should take a daily calcium supplement.  Eat four or five small meals rather than three large meals a day.  Limit foods that are high in fat and processed sugars, such as fried and sweet foods.  To prevent constipation: ? Drink enough fluid to keep your urine clear or pale yellow. ? Eat foods that are high in fiber, such as fresh fruits and vegetables, whole grains, and beans. Activity  Exercise only as directed by your health care provider. Most women can continue their usual exercise routine during pregnancy. Try to exercise for 30 minutes at least 5 days a week. Stop exercising if you experience uterine contractions.  Avoid heavy lifting.  Do   not exercise in extreme heat or humidity, or at high altitudes.  Wear low-heel, comfortable shoes.  Practice good posture.  You may continue to have sex unless your health care provider tells you otherwise. Relieving pain and discomfort  Take frequent breaks and rest with your legs elevated if you have leg cramps or low back pain.  Take warm sitz baths to soothe any pain or discomfort caused by hemorrhoids. Use hemorrhoid cream if your health care provider approves.  Wear a good support bra to prevent discomfort from breast tenderness.  If you develop varicose veins: ? Wear support pantyhose or compression stockings as told by your healthcare provider. ? Elevate your feet for 15 minutes, 3-4 times a day. Prenatal care  Write down your questions. Take them to your prenatal visits.  Keep all your prenatal visits as told by your health care provider. This is important. Safety  Wear your seat belt at all times when driving.  Make  a list of emergency phone numbers, including numbers for family, friends, the hospital, and police and fire departments. General instructions  Avoid cat litter boxes and soil used by cats. These carry germs that can cause birth defects in the baby. If you have a cat, ask someone to clean the litter box for you.  Do not travel far distances unless it is absolutely necessary and only with the approval of your health care provider.  Do not use hot tubs, steam rooms, or saunas.  Do not drink alcohol.  Do not use any products that contain nicotine or tobacco, such as cigarettes and e-cigarettes. If you need help quitting, ask your health care provider.  Do not use any medicinal herbs or unprescribed drugs. These chemicals affect the formation and growth of the baby.  Do not douche or use tampons or scented sanitary pads.  Do not cross your legs for long periods of time.  To prepare for the arrival of your baby: ? Take prenatal classes to understand, practice, and ask questions about labor and delivery. ? Make a trial run to the hospital. ? Visit the hospital and tour the maternity area. ? Arrange for maternity or paternity leave through employers. ? Arrange for family and friends to take care of pets while you are in the hospital. ? Purchase a rear-facing car seat and make sure you know how to install it in your car. ? Pack your hospital bag. ? Prepare the baby's nursery. Make sure to remove all pillows and stuffed animals from the baby's crib to prevent suffocation.  Visit your dentist if you have not gone during your pregnancy. Use a soft toothbrush to brush your teeth and be gentle when you floss. Contact a health care provider if:  You are unsure if you are in labor or if your water has broken.  You become dizzy.  You have mild pelvic cramps, pelvic pressure, or nagging pain in your abdominal area.  You have lower back pain.  You have persistent nausea, vomiting, or  diarrhea.  You have an unusual or bad smelling vaginal discharge.  You have pain when you urinate. Get help right away if:  Your water breaks before 37 weeks.  You have regular contractions less than 5 minutes apart before 37 weeks.  You have a fever.  You are leaking fluid from your vagina.  You have spotting or bleeding from your vagina.  You have severe abdominal pain or cramping.  You have rapid weight loss or weight gain.  You have   shortness of breath with chest pain.  You notice sudden or extreme swelling of your face, hands, ankles, feet, or legs.  Your baby makes fewer than 10 movements in 2 hours.  You have severe headaches that do not go away when you take medicine.  You have vision changes. Summary  The third trimester is from week 28 through week 40, months 7 through 9. The third trimester is a time when the unborn baby (fetus) is growing rapidly.  During the third trimester, your discomfort may increase as you and your baby continue to gain weight. You may have abdominal, leg, and back pain, sleeping problems, and an increased need to urinate.  During the third trimester your breasts will keep growing and they will continue to become tender. A yellow fluid (colostrum) may leak from your breasts. This is the first milk you are producing for your baby.  False labor is a condition in which you feel small, irregular tightenings of the muscles in the womb (contractions) that eventually go away. These are called Braxton Hicks contractions. Contractions may last for hours, days, or even weeks before true labor sets in.  Signs of labor can include: abdominal cramps; regular contractions that start at 10 minutes apart and become stronger and more frequent with time; watery or bloody mucus discharge that comes from the vagina; increased pelvic pressure and dull back pain; and leaking of amniotic fluid. This information is not intended to replace advice given to you by your  health care provider. Make sure you discuss any questions you have with your health care provider. Document Released: 12/24/2000 Document Revised: 02/05/2016 Document Reviewed: 02/05/2016 Elsevier Interactive Patient Education  2019 [employer]  

## 2018-03-25 NOTE — Progress Notes (Signed)
    Routine Prenatal Care Visit  Subjective  Marissa Andrews is a 28 y.o. [id number] at [redacted]w[redacted]d being seen today for ongoing prenatal care.  She is currently monitored for the following issues for this low-risk pregnancy and has Supervision of normal pregnancy; Thrombocytopenia affecting pregnancy, antepartum (HCC); and History of stillbirth in currently pregnant patient, first trimester on their problem list.  ----------------------------------------------------------------------------------- Patient reports fatigue.   Contractions: Not present. Vag. Bleeding: None.  Movement: Present. No leaking of fluid.  ----------------------------------------------------------------------------------- The following portions of the patient's history were reviewed and updated as appropriate: allergies, current medications, past family history, past medical history, past social history, past surgical history and problem list. Problem list updated.  Objective  Blood pressure (!) 102/50, weight 195 lb (88.5 kg), last menstrual period 08/13/2017. Pregravid weight 204 lb (92.5 kg) Total Weight Gain -9 lb (-4.082 kg)Body mass index is 32.45 kg/m.   Urinalysis: Urine dipstick shows negative for glucose, positive for protein (trace).  Fetal Status: Fetal Heart Rate (bpm): 141 Fundal Height: 32 cm Movement: Present     General:  Alert, oriented and cooperative. Patient is in no acute distress.  Skin: Skin is warm and dry. No rash noted.   Cardiovascular: Normal heart rate noted  Respiratory: Normal respiratory effort, no problems with respiration noted  Abdomen: Soft, gravid, appropriate for gestational age. Pain/Pressure: Absent     Pelvic:  Cervical exam deferred        Extremities: Normal range of motion.  Edema: None  Mental Status: Normal mood and affect. Normal behavior. Normal judgment and thought content.    Assessment   28 y.o. [id number] at [redacted]w[redacted]d, EDD 05/20/2018 by Last Menstrual Period presenting  for a routine prenatal visit.  Plan   Pregnancy #5 Problems (from 08/13/17 to present)    Problem Noted Resolved   Supervision of normal pregnancy 08/13/2014 by Ward, Elenora Fender, MD No   Overview Signed 09/25/2017 12:40 AM by Natale Milch, MD      [facility]inic [facility] Prenatal Labs  Dating  Blood type:     Genetic Screen 1 Screen:     AFP:      Quad:      NIPS:    Antibody:   Anatomic [place]  Rubella:   Varicella: @VZVIGG @  GTT Early:        28 wk:      RPR:     Rhogam  HBsAg:     TDaP vaccine                       HIV:     Flu Shot    Declines                            GBS:   Contraception  Pap:  CBB     CS/VBAC    Baby Food    Support Person  Jess Barters            GTT/28 week labs today. TDaP vaccine accepted at this visit.   Concerned about thrombocytopenia because of history with last delivery. Discussed that platelet count will be checked with labs today, but admission labs are what anesthesia would use to determine eligibility for epidural anesthesia.  Please refer to After Visit Summary for other counseling recommendations.   Return in about 2 weeks (around 04/08/2018) for ROB.  Marcelyn Bruins, CNM 03/25/2018

## 2018-03-25 NOTE — Progress Notes (Signed)
ROB/GTT/US C/o No concerns, no vb,denies lof, and good FM Tdap/BT signed today

## 2018-03-26 LAB — 28 WEEK RH+PANEL
BASOS ABS: 0 10*3/uL (ref 0.0–0.2)
Basos: 0 %
EOS (ABSOLUTE): 0 10*3/uL (ref 0.0–0.4)
Eos: 0 %
Gestational Diabetes Screen: 110 mg/dL (ref 65–139)
HEMATOCRIT: 32.4 % — AB (ref 34.0–46.6)
HIV Screen 4th Generation wRfx: NONREACTIVE
Hemoglobin: 11.1 g/dL (ref 11.1–15.9)
IMMATURE GRANULOCYTES: 1 %
Immature Grans (Abs): 0.1 10*3/uL (ref 0.0–0.1)
LYMPHS ABS: 1.7 10*3/uL (ref 0.7–3.1)
Lymphs: 21 %
MCH: 29.8 pg (ref 26.6–33.0)
MCHC: 34.3 g/dL (ref 31.5–35.7)
MCV: 87 fL (ref 79–97)
Monocytes Absolute: 0.7 10*3/uL (ref 0.1–0.9)
Monocytes: 9 %
NEUTROS PCT: 69 %
Neutrophils Absolute: 5.4 10*3/uL (ref 1.4–7.0)
Platelets: 106 10*3/uL — ABNORMAL LOW (ref 150–450)
RBC: 3.73 x10E6/uL — ABNORMAL LOW (ref 3.77–5.28)
RDW: 12.6 % (ref 11.7–15.4)
RPR: NONREACTIVE
WBC: 7.8 10*3/uL (ref 3.4–10.8)

## 2018-04-08 ENCOUNTER — Encounter: Payer: Medicaid Other | Admitting: Advanced Practice Midwife

## 2018-04-28 ENCOUNTER — Other Ambulatory Visit: Payer: Self-pay

## 2018-04-28 ENCOUNTER — Encounter: Payer: Medicaid Other | Admitting: Maternal Newborn

## 2018-04-28 ENCOUNTER — Ambulatory Visit (INDEPENDENT_AMBULATORY_CARE_PROVIDER_SITE_OTHER): Payer: Medicaid Other | Admitting: Obstetrics and Gynecology

## 2018-04-28 ENCOUNTER — Other Ambulatory Visit (HOSPITAL_COMMUNITY)
Admission: RE | Admit: 2018-04-28 | Discharge: 2018-04-28 | Disposition: A | Discharge status: Home / Self Care | Payer: Medicaid Other | Source: Ambulatory Visit | Attending: Obstetrics and Gynecology | Admitting: Obstetrics and Gynecology

## 2018-04-28 ENCOUNTER — Encounter: Payer: Self-pay | Admitting: Obstetrics and Gynecology

## 2018-04-28 VITALS — BP 110/74 | Wt 201.0 lb

## 2018-04-28 DIAGNOSIS — O0990 Supervision of high risk pregnancy, unspecified, unspecified trimester: Secondary | ICD-10-CM | POA: Insufficient documentation

## 2018-04-28 DIAGNOSIS — O09291 Supervision of pregnancy with other poor reproductive or obstetric history, first trimester: Secondary | ICD-10-CM

## 2018-04-28 DIAGNOSIS — Z3480 Encounter for supervision of other normal pregnancy, unspecified trimester: Secondary | ICD-10-CM | POA: Diagnosis present

## 2018-04-28 DIAGNOSIS — A6009 Herpesviral infection of other urogenital tract: Secondary | ICD-10-CM | POA: Insufficient documentation

## 2018-04-28 DIAGNOSIS — O34219 Maternal care for unspecified type scar from previous cesarean delivery: Secondary | ICD-10-CM

## 2018-04-28 DIAGNOSIS — Z3A36 36 weeks gestation of pregnancy: Secondary | ICD-10-CM | POA: Insufficient documentation

## 2018-04-28 DIAGNOSIS — O98319 Other infections with a predominantly sexual mode of transmission complicating pregnancy, unspecified trimester: Secondary | ICD-10-CM

## 2018-04-28 DIAGNOSIS — D696 Thrombocytopenia, unspecified: Secondary | ICD-10-CM

## 2018-04-28 DIAGNOSIS — O98313 Other infections with a predominantly sexual mode of transmission complicating pregnancy, third trimester: Secondary | ICD-10-CM

## 2018-04-28 DIAGNOSIS — O09299 Supervision of pregnancy with other poor reproductive or obstetric history, unspecified trimester: Secondary | ICD-10-CM | POA: Insufficient documentation

## 2018-04-28 DIAGNOSIS — O09293 Supervision of pregnancy with other poor reproductive or obstetric history, third trimester: Secondary | ICD-10-CM

## 2018-04-28 DIAGNOSIS — O99113 Other diseases of the blood and blood-forming organs and certain disorders involving the immune mechanism complicating pregnancy, third trimester: Secondary | ICD-10-CM

## 2018-04-28 DIAGNOSIS — O99119 Other diseases of the blood and blood-forming organs and certain disorders involving the immune mechanism complicating pregnancy, unspecified trimester: Secondary | ICD-10-CM

## 2018-04-28 MED ORDER — VALACYCLOVIR HCL 500 MG PO TABS: 500.0000 mg | ORAL_TABLET | Freq: Two times a day (BID) | ORAL | 2 refills | Status: AC

## 2018-04-28 NOTE — Progress Notes (Signed)
Routine Prenatal Care Visit  Subjective  Ryann Ezella Beas is a 28 y.o. [id number] at [redacted]w[redacted]d being seen today for ongoing prenatal care.  She is currently monitored for the following issues for this high-risk pregnancy and has Supervision of high risk pregnancy, antepartum; Thrombocytopenia affecting pregnancy, antepartum (HCC); History of stillbirth in currently pregnant patient, first trimester; H/O cesarean section complicating pregnancy; History of postpartum hemorrhage, currently pregnant; and Genital herpes affecting pregnancy on their problem list.  ----------------------------------------------------------------------------------- Patient reports no complaints.   Contractions: Not present. Vag. Bleeding: None.  Movement: Present. Denies leaking of fluid.  ----------------------------------------------------------------------------------- The following portions of the patient's history were reviewed and updated as appropriate: allergies, current medications, past family history, past medical history, past social history, past surgical history and problem list. Problem list updated.   Objective  Blood pressure 110/74, weight 201 lb (91.2 kg), last menstrual period 08/13/2017, unknown if currently breastfeeding. Pregravid weight 204 lb (92.5 kg) Total Weight Gain -3 lb (-1.361 kg) Urinalysis: Urine Protein    Urine Glucose    Fetal Status: Fetal Heart Rate (bpm): 135 Fundal Height: 36 cm Movement: Present     General:  Alert, oriented and cooperative. Patient is in no acute distress.  Skin: Skin is warm and dry. No rash noted.   Cardiovascular: Normal heart rate noted  Respiratory: Normal respiratory effort, no problems with respiration noted  Abdomen: Soft, gravid, appropriate for gestational age. Pain/Pressure: Absent     Pelvic:  Cervical exam deferred        Extremities: Normal range of motion.  Edema: None  Mental Status: Normal mood and affect. Normal behavior. Normal judgment  and thought content.   Assessment   28 y.o. [id number] at [redacted]w[redacted]d by  05/20/2018, by Last Menstrual Period presenting for routine prenatal visit  Plan   Pregnancy #5 Problems (from 08/13/17 to present)    Problem Noted Resolved   H/O cesarean section complicating pregnancy 04/28/2018 by Conard Novak, MD No   History of postpartum hemorrhage, currently pregnant 04/28/2018 by Conard Novak, MD No   Overview Signed 04/28/2018 11:15 AM by Conard Novak, MD    - after c-section in 2016, requiring Bakri Balloon.      Genital herpes affecting pregnancy 04/28/2018 by Conard Novak, MD No   Overview Signed 04/28/2018 11:34 AM by Conard Novak, MD    Valtrex @ 36 wks      Thrombocytopenia affecting pregnancy, antepartum (HCC) 08/15/2014 by Nadara Mustard, MD No   Overview Signed 04/28/2018 11:35 AM by Conard Novak, MD    106 at 28 weeks []  recheck at 36 weeks (h/o platelet count of 66)      Supervision of high risk pregnancy, antepartum 08/13/2014 by Ward, Elenora Fender, MD No   Overview Addendum 03/25/2018  4:08 PM by Oswaldo Conroy, CNM      [facility] Prenatal Labs  Dating L=7 Blood type: B/Positive/-- (09/12 1442)   Genetic Screen NIPS: Negative XY Antibody:Negative (09/12 1442)  Anatomic [place] Complete 1/16, normal except for [facility] Rubella: 4.49 (09/12 1442) Varicella: Immune  GTT 28 wk:      RPR: Non Reactive (09/12 1442)   Rhogam N/A HBsAg: Negative (09/12 1442)   TDaP vaccine 03/25/2018                      HIV: Non Reactive (09/12 1442)   Flu Shot Declines  GBS:   Contraception IUD Pap: 09/24/2017, NILM and HPV Negative  CBB     CS/VBAC    Baby Food    Support Person  Jess BartersXavier            Preterm labor symptoms and general obstetric precautions including but not limited to vaginal bleeding, contractions, leaking of fluid and fetal movement were reviewed in detail with the patient. Please refer to After Visit Summary for other  counseling recommendations.   - platelet count today - start valtrex, rx prescribed - c-section scheduled 4/30 AMS (did prior). Patient counseled regarding TOLAC vs c-section. Elects c-section - GBS/aptima today  Return in about 1 week (around 05/05/2018) for Routine Prenatal Appointment.  Thomasene MohairStephen , MD, Merlinda FrederickFACOG [facility], [facility] 04/28/2018 11:37 AM

## 2018-04-29 ENCOUNTER — Telehealth: Payer: Self-pay | Admitting: Obstetrics and Gynecology

## 2018-04-29 LAB — PLATELET COUNT: Platelets: 103 10*3/uL — ABNORMAL LOW (ref 150–450)

## 2018-04-29 NOTE — Telephone Encounter (Signed)
Patient is aware of H&P on 05/12/18 @ 8:10am w/ Dr. Bonney Aid, Pre-admit Testing afterwards, and OR on 05/13/18.

## 2018-04-29 NOTE — Telephone Encounter (Signed)
-----   Message from Conard Novak, MD sent at 04/28/2018 11:36 AM EDT ----- Regarding: Schedule surgery Surgery Booking Request Patient Full Name:  Marissa Andrews  MRN: [id number]  DOB: [birth year]  Surgeon: Vena Austria, MD  Requested Surgery Date and Time: 05/13/2018 Primary Diagnosis AND Code: history of cesarean section, desires repeat Secondary Diagnosis and Code:  Surgical Procedure: Cesarean Section L&D Notification: Yes Admission Status: surgery admit Length of Surgery: 60 minutes Special Case Needs: none H&P: TBD (date) Phone Interview???: yes Interpreter: Language:  Medical Clearance: no Special Scheduling Instructions: none

## 2018-04-30 LAB — STREP GP B NAA: Strep Gp B NAA: NEGATIVE

## 2018-05-01 ENCOUNTER — Observation Stay
Admission: EM | Admit: 2018-05-01 | Discharge: 2018-05-01 | Disposition: A | Discharge status: Home / Self Care | Payer: Medicaid Other | Attending: Certified Nurse Midwife | Admitting: Certified Nurse Midwife

## 2018-05-01 ENCOUNTER — Other Ambulatory Visit: Payer: Self-pay

## 2018-05-01 ENCOUNTER — Encounter: Payer: Self-pay | Admitting: *Deleted

## 2018-05-01 DIAGNOSIS — O471 False labor at or after 37 completed weeks of gestation: Secondary | ICD-10-CM | POA: Diagnosis not present

## 2018-05-01 DIAGNOSIS — O99013 Anemia complicating pregnancy, third trimester: Secondary | ICD-10-CM | POA: Diagnosis not present

## 2018-05-01 DIAGNOSIS — O98313 Other infections with a predominantly sexual mode of transmission complicating pregnancy, third trimester: Secondary | ICD-10-CM

## 2018-05-01 DIAGNOSIS — O09299 Supervision of pregnancy with other poor reproductive or obstetric history, unspecified trimester: Secondary | ICD-10-CM

## 2018-05-01 DIAGNOSIS — O0990 Supervision of high risk pregnancy, unspecified, unspecified trimester: Secondary | ICD-10-CM

## 2018-05-01 DIAGNOSIS — O99119 Other diseases of the blood and blood-forming organs and certain disorders involving the immune mechanism complicating pregnancy, unspecified trimester: Secondary | ICD-10-CM

## 2018-05-01 DIAGNOSIS — D6959 Other secondary thrombocytopenia: Secondary | ICD-10-CM | POA: Insufficient documentation

## 2018-05-01 DIAGNOSIS — O99113 Other diseases of the blood and blood-forming organs and certain disorders involving the immune mechanism complicating pregnancy, third trimester: Secondary | ICD-10-CM | POA: Diagnosis not present

## 2018-05-01 DIAGNOSIS — Z3A37 37 weeks gestation of pregnancy: Secondary | ICD-10-CM | POA: Diagnosis not present

## 2018-05-01 DIAGNOSIS — O34219 Maternal care for unspecified type scar from previous cesarean delivery: Secondary | ICD-10-CM | POA: Diagnosis not present

## 2018-05-01 DIAGNOSIS — A6009 Herpesviral infection of other urogenital tract: Secondary | ICD-10-CM

## 2018-05-01 DIAGNOSIS — D696 Thrombocytopenia, unspecified: Secondary | ICD-10-CM

## 2018-05-01 HISTORY — DX: Other diseases of the blood and blood-forming organs and certain disorders involving the immune mechanism complicating pregnancy, unspecified trimester: O99.119

## 2018-05-01 HISTORY — DX: Thrombocytopenia, unspecified: D69.6

## 2018-05-01 LAB — URINALYSIS, COMPLETE (UACMP) WITH MICROSCOPIC
Bacteria, UA: NONE SEEN
Bilirubin Urine: NEGATIVE
Glucose, UA: NEGATIVE mg/dL
Hgb urine dipstick: NEGATIVE
Ketones, ur: 20 mg/dL — AB
Nitrite: NEGATIVE
Protein, ur: NEGATIVE mg/dL
Specific Gravity, Urine: 1.014 (ref 1.005–1.030)
WBC, UA: >50 WBC/hpf — ABNORMAL HIGH (ref 0–5)
pH: 7 (ref 5.0–8.0)

## 2018-05-01 LAB — CBC
HCT: 30.9 % — ABNORMAL LOW (ref 36.0–46.0)
Hemoglobin: 10 g/dL — ABNORMAL LOW (ref 12.0–15.0)
MCH: 28.4 pg (ref 26.0–34.0)
MCHC: 32.4 g/dL (ref 30.0–36.0)
MCV: 87.8 fL (ref 80.0–100.0)
Platelets: 113 10*3/uL — ABNORMAL LOW (ref 150–400)
RBC: 3.52 MIL/uL — ABNORMAL LOW (ref 3.87–5.11)
RDW: 13.5 % (ref 11.5–15.5)
WBC: 8.1 10*3/uL (ref 4.0–10.5)
nRBC: 0 % (ref 0.0–0.2)

## 2018-05-01 LAB — TYPE AND SCREEN
ABO/RH(D): B POS
Antibody Screen: NEGATIVE

## 2018-05-01 LAB — CERVICOVAGINAL ANCILLARY ONLY
Chlamydia: NEGATIVE
Neisseria Gonorrhea: NEGATIVE
Trichomonas: NEGATIVE

## 2018-05-01 MED ORDER — LACTATED RINGERS IV SOLN: INTRAVENOUS | Status: DC

## 2018-05-01 MED ORDER — ZOLPIDEM TARTRATE 5 MG PO TABS: 5.0000 mg | ORAL_TABLET | Freq: Every evening | ORAL | 0 refills | Status: DC | PRN

## 2018-05-01 MED ORDER — LACTATED RINGERS IV BOLUS
500.0000 mL | Freq: Once | INTRAVENOUS | Status: AC
  Administered 2018-05-01: 15:00:00 500 mL via INTRAVENOUS

## 2018-05-01 MED ORDER — CEPHALEXIN 500 MG PO CAPS: 500.0000 mg | ORAL_CAPSULE | Freq: Three times a day (TID) | ORAL | 0 refills | Status: AC

## 2018-05-01 NOTE — Discharge Instructions (Signed)
Return to labor and delivery for stronger contractions, leaking, water, bleeding like a period, or decreased fetal movement. Start your Valtrex as soon as possible. This is important, particularly if you are going to deliver vaginally.

## 2018-05-01 NOTE — OB Triage Note (Signed)
Discharge home. Left floor ambulatory. Discharge instructions given. Labor precautions and medications discussed. Elaina Hoops

## 2018-05-01 NOTE — H&P (Signed)
OB History & Physical   History of Present Illness:  Chief Complaint:  Frequent contractions since 0900 this AM. HPI:  Marissa Andrews is a 28 y.o. [phone] female with EDC=05/20/2018 at [redacted]w[redacted]d dated by LMP=7wk ultrasound.  Her pregnancy has been complicated by gestational thrombocytopenia, anemia, prior Cesarean section for arrest of  active phase labor, a history of PPH after Cesarean section, an IUFD at 26 weeks, and a history of HSV (has not started her Valtrex).  Her last platelet count was 103K last week.  She presents to L&D for evaluation of labor. She is currently scheduled for a repeat Cesarean section on 4/30, but is possibly interested in a vaginal delivery if she can get an epidural. She has been counseled on the risks of uterine rupture with a spontaneous labor being <1%.  Baby has been active. No vaginal bleeding or leakage of fluid. Her urine is dark in color although she has been increasing her water intake over the last month. No dysuria.  Prenatal care site: Prenatal care at [facility] has also been remarkable for  [facility] P[facility]atal Labs  Dating L=7 Blood type: B/Positive/-- (09/12 1442)   Genetic Screen NIPS: Negative XY Antibody:Negative (09/12 1442)  Anatomic [place] Complete 1/16, normal except for Marissa Andrews Rubella: 4.49 (09/12 1442) Varicella: Immune  GTT 28 wk:      RPR: Non Reactive (09/12 1442)   Rhogam N/A HBsAg: Negative (09/12 1442)   TDaP vaccine 03/25/2018                      HIV: Non Reactive (09/12 1442)   Flu Shot Declines                            GBS: negative  Contraception IUD Pap: 09/24/2017, NILM and HPV Negative  CBB   Platelet 4/15 103K  CS/VBAC  Platelet 3/12/ 106K  Baby Food  Platelet 9/12 161K  Support Person  Marissa Andrews          Maternal Medical History:   Past Medical History:  Diagnosis Date  . Genital herpes   . Gestational thrombocytopenia (HCC)   . Postpartum hemorrhage 08/2014  . Stillbirth 04/26/2015   [redacted] weeks gestation     Past Surgical History:  Procedure Laterality Date  . APPENDECTOMY     Pt reports appendectomy in 2006  . CESAREAN SECTION N/A 08/14/2014   Procedure: CESAREAN SECTION;  Surgeon: Marissa Austria, MD;  Location: [facility] ORS;  Service: Obstetrics;  Laterality: N/A;    Allergies  Allergen Reactions  . Cherry Hives  . Mushroom Extract Complex Swelling    Prior to Admission medications   Medication Sig Start Date End Date Taking? Authorizing Provider  Ferrous Sulfate (IRON) 28 MG TABS Take 28 mg by mouth daily.    Yes [provider]  Prenat-FeFum-FePo-FA-Omega 3 (TARON-C DHA) 53.5-38-1 MG CAPS Take 1 tablet by mouth daily. 09/17/17  Yes [provider]  acetaminophen (TYLENOL) 325 MG tablet Take 2 tablets (650 mg total) by mouth every 4 (four) hours as needed (for pain scale < 4). Patient not taking: Reported on 03/25/2018 08/18/14   Ward, Elenora Fender, MD  valACYclovir (VALTREX) 500 MG tablet Take 1 tablet (500 mg total) by mouth 2 (two) times daily for 30 days. 04/28/18 05/28/18  Conard Novak, MD      Social History: She  reports that she has never smoked. She has never used smokeless  tobacco. She reports that she does not drink alcohol or use drugs.  Family History: family history is not on file.   Review of Systems: Negative x 10 systems reviewed except as noted in the HPI.      Physical Exam:  Vital Signs: BP (!) 110/53   Pulse 83   Temp 98.7 F (37.1 C) (Oral)   Resp 18   Ht 5\' 5"  (1.651 m)   Wt 91.6 kg   LMP 08/13/2017   BMI 33.61 kg/m  General: gravid BF grimacing with some contractions HEENT: normocephalic, atraumatic Heart: regular rate  Lungs: normal respiratory effort Abdomen: soft, gravid, non-tender, cephalic presentation Pelvic:   External: Normal external female genitalia  Cervix: Dilation: 3 / Effacement (%) long / Station: -2 on arrival   Extremities: non-tender, symmetric, no edema bilaterally.  Neurologic: Alert & oriented x 3.    Baseline FHR: 130 baseline with accelerations to 150s to 160s, moderate variability, no decelerations Toco: contractions 3-5 min apart, some palpate moderate. Assessment:  Marissa Andrews is a 28 y.o. [phone] female at 2881w2d with contractions Prior Cesarean section Gestational thrombocytopenia Plan:  1. Admit for observation. Will check platelets. If platelets>100K, patient may want to have a VBAC. Will monitor for progression. NPO until CBC results back, in case Cesarean section desired for delivery.  2. CBC, T&S, NPO, IVF, RPR- 3. GBS negative.   4. Urinalysis 5. Fetal heart monitoring  Marissa Andrews  05/01/2018 7:36 PM

## 2018-05-01 NOTE — OB Triage Note (Signed)
Pt here c/o ctx since 0900 this am. Denies vaginal bleeding. Denies LOF. Reports good fetal movement. Marissa Andrews

## 2018-05-01 NOTE — Final Progress Note (Signed)
Physician Final Progress Note  Patient ID: Marissa Andrews MRN: [id number] DOB/AGE: Dec 11, 1990 28 y.o.  Admit date: 05/01/2018 Admitting provider: Nadara Mustardobert P Harris, MD/ Trinna Balloonolleen l. Isla Sabree, CNM Discharge date: 05/01/2018   Admission Diagnoses: IUP at 37wk2d with contractions Previous Cesarean section Gestational thrombocytopenia  Discharge Diagnoses:  Prodromal labor Consults: None  Significant Findings/ Diagnostic Studies: Marissa Andrews is a 28 y.o. [phone] female with EDC=05/20/2018 at 10753w2d dated by LMP=7wk ultrasound.  Her pregnancy has been complicated by gestational thrombocytopenia, anemia, prior Cesarean section for arrest of  active phase labor, a history of PPH after Cesarean section, an IUFD at 26 weeks, and a history of HSV (has not started her Valtrex).  She presented with contractions. Denied bleeding or leakage of fluid.  She is scheduled for a Cesarean section 4/30, but was interested in a VBAC when her platetet count returned 113K. She was monitored for labor progression and fetal well being. Her cervix was 3cm/long/-2 on arrival. There was some change after 2 hours to 3cm/50%/-2. Cervix was checked after 4 hours and after patient was up walking and there was no cervical change and baby was then ballotable. There was no progression in intensity of contractions. Fetal heart strip remained reactive with no decelerations.  Results for orders placed or performed during the hospital encounter of 05/01/18 (from the past 24 hour(s))  CBC     Status: Abnormal   Collection Time: 05/01/18  2:43 PM  Result Value Ref Range   WBC 8.1 4.0 - 10.5 K/uL   RBC 3.52 (L) 3.87 - 5.11 MIL/uL   Hemoglobin 10.0 (L) 12.0 - 15.0 g/dL   HCT 14.730.9 (L) 82.936.0 - 56.246.0 %   MCV 87.8 80.0 - 100.0 fL   MCH 28.4 26.0 - 34.0 pg   MCHC 32.4 30.0 - 36.0 g/dL   RDW 13.013.5 86.511.5 - 78.415.5 %   Platelets 113 (L) 150 - 400 K/uL   nRBC 0.0 0.0 - 0.2 %  Type and screen [facility]      Status: None   Collection Time: 05/01/18  2:43 PM  Result Value Ref Range   ABO/RH(D) B POS    Antibody Screen NEG    Sample Expiration      05/04/2018 Performed at [facility], [address] [place], [place] [address]   Urinalysis, Complete w Microscopic     Status: Abnormal   Collection Time: 05/01/18  2:44 PM  Result Value Ref Range   Color, Urine AMBER (A) YELLOW   APPearance HAZY (A) CLEAR   Specific Gravity, Urine 1.014 1.005 - 1.030   pH 7.0 5.0 - 8.0   Glucose, UA NEGATIVE NEGATIVE mg/dL   Hgb urine dipstick NEGATIVE NEGATIVE   Bilirubin Urine NEGATIVE NEGATIVE   Ketones, ur 20 (A) NEGATIVE mg/dL   Protein, ur NEGATIVE NEGATIVE mg/dL   Nitrite NEGATIVE NEGATIVE   Leukocytes,Ua MODERATE (A) NEGATIVE   RBC / HPF 0-5 0 - 5 RBC/hpf   WBC, UA >50 (H) 0 - 5 WBC/hpf   Bacteria, UA NONE SEEN NONE SEEN   Squamous Epithelial / LPF 0-5 0 - 5   Mucus PRESENT    A: IUP at 37.2 weeks with prodromal vs false labor Pyuria R/O UTI Gestational thrombocytopenia  P: Discharge home with labor precautions Urine culture Start Keflex 500 mgm tid x 7 days Encouraged her to start Valtrex 500 mgm BID as prescribed previously Increase water intake Ambien 5 mgm at hs for sleep prn #10/NR  ROB 4/22 if NIL  Procedures: none  Discharge Condition: stable  Disposition: Discharge disposition: 01-Home or Self Care       Diet: Regular diet  Discharge Activity: Activity as tolerated  Discharge Instructions    Discharge patient   Complete by:  As directed    Discharge disposition:  01-Home or Self Care   Discharge patient date:  05/01/2018     Allergies as of 05/01/2018      Reactions   Cherry Hives   Mushroom Extract Complex Swelling      Medication List    TAKE these medications   acetaminophen 325 MG tablet Commonly known as:  Tylenol Take 2 tablets (650 mg total) by mouth every 4 (four) hours as needed (for pain scale < 4).   cephALEXin 500 MG  capsule Commonly known as:  KEFLEX Take 1 capsule (500 mg total) by mouth 3 (three) times daily for 7 days.   Iron 28 MG Tabs Take 28 mg by mouth daily.   Taron-C DHA 53.5-38-1 MG Caps Take 1 tablet by mouth daily.   valACYclovir 500 MG tablet Commonly known as:  VALTREX Take 1 tablet (500 mg total) by mouth 2 (two) times daily for 30 days.   zolpidem 5 MG tablet Commonly known as:  AMBIEN Take 1 tablet (5 mg total) by mouth at bedtime as needed for sleep.        Total time spent taking care of this patient: 30 minutes  Signed: Farrel Conners 05/01/2018, 7:58 PM

## 2018-05-03 LAB — RPR: RPR Ser Ql: NONREACTIVE

## 2018-05-03 LAB — URINE CULTURE: Special Requests: NORMAL

## 2018-05-05 ENCOUNTER — Ambulatory Visit (INDEPENDENT_AMBULATORY_CARE_PROVIDER_SITE_OTHER): Payer: Medicaid Other | Admitting: Obstetrics and Gynecology

## 2018-05-05 ENCOUNTER — Other Ambulatory Visit: Payer: Self-pay

## 2018-05-05 ENCOUNTER — Encounter: Payer: Medicaid Other | Admitting: Obstetrics & Gynecology

## 2018-05-05 DIAGNOSIS — O34219 Maternal care for unspecified type scar from previous cesarean delivery: Secondary | ICD-10-CM

## 2018-05-05 DIAGNOSIS — D696 Thrombocytopenia, unspecified: Secondary | ICD-10-CM

## 2018-05-05 DIAGNOSIS — O0990 Supervision of high risk pregnancy, unspecified, unspecified trimester: Secondary | ICD-10-CM

## 2018-05-05 DIAGNOSIS — O98313 Other infections with a predominantly sexual mode of transmission complicating pregnancy, third trimester: Secondary | ICD-10-CM

## 2018-05-05 DIAGNOSIS — Z3A37 37 weeks gestation of pregnancy: Secondary | ICD-10-CM

## 2018-05-05 DIAGNOSIS — A6009 Herpesviral infection of other urogenital tract: Secondary | ICD-10-CM

## 2018-05-05 DIAGNOSIS — O99119 Other diseases of the blood and blood-forming organs and certain disorders involving the immune mechanism complicating pregnancy, unspecified trimester: Secondary | ICD-10-CM

## 2018-05-05 DIAGNOSIS — O99113 Other diseases of the blood and blood-forming organs and certain disorders involving the immune mechanism complicating pregnancy, third trimester: Secondary | ICD-10-CM

## 2018-05-05 DIAGNOSIS — O09291 Supervision of pregnancy with other poor reproductive or obstetric history, first trimester: Secondary | ICD-10-CM

## 2018-05-05 DIAGNOSIS — O09299 Supervision of pregnancy with other poor reproductive or obstetric history, unspecified trimester: Secondary | ICD-10-CM

## 2018-05-05 DIAGNOSIS — O09293 Supervision of pregnancy with other poor reproductive or obstetric history, third trimester: Secondary | ICD-10-CM

## 2018-05-05 NOTE — Progress Notes (Signed)
I connected with Marissa Andrews  on 05/05/18 at  9:30 AM EDT by telephone and verified that I am speaking with the correct person using two identifiers.   I discussed the limitations, risks, security and privacy concerns of performing an evaluation and management service by telephone and the availability of in person appointments. I also discussed with the patient that there may be a patient responsible charge related to this service. The patient expressed understanding and agreed to proceed.  The patient was at home I spoke with the patient from my workstation phone The names of people involved in this encounter were: Versie Beecher Andrews , and Vena Austria   Routine Prenatal Care Visit  Subjective  Marissa Andrews is a 28 y.o. [phone] at [redacted]w[redacted]d being seen today for ongoing prenatal care.  She is currently monitored for the following issues for this high-risk pregnancy and has Supervision of high risk pregnancy, antepartum; Thrombocytopenia affecting pregnancy, antepartum (HCC); History of stillbirth in currently pregnant patient, first trimester; H/O cesarean section complicating pregnancy; History of postpartum hemorrhage, currently pregnant; Genital herpes affecting pregnancy; and Indication for care in labor and delivery, antepartum on their problem list.  ----------------------------------------------------------------------------------- Patient reports no complaints.   Contractions: Not present. Vag. Bleeding: None.  Movement: Present. Denies leaking of fluid.  ----------------------------------------------------------------------------------- The following portions of the patient's history were reviewed and updated as appropriate: allergies, current medications, past family history, past medical history, past social history, past surgical history and problem list. Problem list updated.   Objective  Last menstrual period 08/13/2017, unknown if currently breastfeeding.  Pregravid weight 204 lb (92.5 kg) Total Weight Gain -2 lb (-0.907 kg) Urinalysis:      Fetal Status:     Movement: Present     No physical exam as this was a remote telephone visit to promote social distancing during the current COVID-19 Pandemic   Assessment   28 y.o. [id number] at [redacted]w[redacted]d by  05/20/2018, by Last Menstrual Period presenting for routine prenatal visit  Plan   Pregnancy #5 Problems (from 08/13/17 to present)    Problem Noted Resolved   H/O cesarean section complicating pregnancy 04/28/2018 by Conard Novak, MD No   History of postpartum hemorrhage, currently pregnant 04/28/2018 by Conard Novak, MD No   Overview Signed 04/28/2018 11:15 AM by Conard Novak, MD    - after c-section in 2016, requiring Bakri Balloon.      Genital herpes affecting pregnancy 04/28/2018 by Conard Novak, MD No   Overview Signed 04/28/2018 11:34 AM by Conard Novak, MD    Valtrex @ 36 wks      Thrombocytopenia affecting pregnancy, antepartum (HCC) 08/15/2014 by Nadara Mustard, MD No   Overview Addendum 05/01/2018 12:54 PM by Farrel Conners, CNM    106 at 28 weeks []  recheck at 36 weeks (h/o platelet count of 66)      Supervision of high risk pregnancy, antepartum 08/13/2014 by Ward, Elenora Fender, MD No   Overview Addendum 03/25/2018  4:08 PM by Oswaldo Conroy, CNM      [facility] Prenatal [facility]  Dating L=7 Blood type: B/Positive/-- (09/12 1442)   Genetic Screen NIPS: Negative XY Antibody:Negative (09/12 1442)  Anatomic [place] Complete 1/16, normal except for [facility] Rubella: 4.49 (09/12 1442) Varicella: Immune  GTT 28 wk:      RPR: Non Reactive (09/12 1442)   Rhogam N/A HBsAg: Negative (09/12 1442)   TDaP vaccine 03/25/2018  HIV: Non Reactive (09/12 1442)   Flu Shot Declines                            GBS:   Contraception IUD Pap: 09/24/2017, NILM and HPV Negative  CBB     CS/VBAC c-section   Baby Food    Support Person  Jess BartersXavier                Gestational age appropriate obstetric precautions including but not limited to vaginal bleeding, contractions, leaking of fluid and fetal movement were reviewed in detail with the patient.    Return in about 1 week (around 05/12/2018) for ROB/H&P.  Vena AustriaAndreas Jaclynn Laumann, MD, Merlinda FrederickFACOG [facility], [facility] 05/05/2018, 9:49 AM

## 2018-05-05 NOTE — Progress Notes (Signed)
ROB Ctx have slowed down,  Pelvic pain

## 2018-05-12 ENCOUNTER — Other Ambulatory Visit: Payer: Self-pay

## 2018-05-12 ENCOUNTER — Encounter
Admission: RE | Admit: 2018-05-12 | Discharge: 2018-05-12 | Disposition: A | Discharge status: Home / Self Care | Payer: Medicaid Other | Source: Ambulatory Visit | Attending: Obstetrics and Gynecology | Admitting: Obstetrics and Gynecology

## 2018-05-12 ENCOUNTER — Ambulatory Visit (INDEPENDENT_AMBULATORY_CARE_PROVIDER_SITE_OTHER): Payer: Medicaid Other | Admitting: Obstetrics and Gynecology

## 2018-05-12 ENCOUNTER — Encounter: Payer: Self-pay | Admitting: Obstetrics and Gynecology

## 2018-05-12 VITALS — BP 128/58 | HR 92 | Ht 65.0 in | Wt 204.0 lb

## 2018-05-12 DIAGNOSIS — O09299 Supervision of pregnancy with other poor reproductive or obstetric history, unspecified trimester: Secondary | ICD-10-CM

## 2018-05-12 DIAGNOSIS — O99113 Other diseases of the blood and blood-forming organs and certain disorders involving the immune mechanism complicating pregnancy, third trimester: Secondary | ICD-10-CM

## 2018-05-12 DIAGNOSIS — Z01812 Encounter for preprocedural laboratory examination: Secondary | ICD-10-CM

## 2018-05-12 DIAGNOSIS — O34219 Maternal care for unspecified type scar from previous cesarean delivery: Secondary | ICD-10-CM

## 2018-05-12 DIAGNOSIS — Z3A38 38 weeks gestation of pregnancy: Secondary | ICD-10-CM

## 2018-05-12 DIAGNOSIS — O0990 Supervision of high risk pregnancy, unspecified, unspecified trimester: Secondary | ICD-10-CM

## 2018-05-12 DIAGNOSIS — D696 Thrombocytopenia, unspecified: Secondary | ICD-10-CM

## 2018-05-12 DIAGNOSIS — O99119 Other diseases of the blood and blood-forming organs and certain disorders involving the immune mechanism complicating pregnancy, unspecified trimester: Principal | ICD-10-CM

## 2018-05-12 DIAGNOSIS — O09293 Supervision of pregnancy with other poor reproductive or obstetric history, third trimester: Secondary | ICD-10-CM

## 2018-05-12 HISTORY — DX: Major depressive disorder, single episode, unspecified: F32.9

## 2018-05-12 HISTORY — DX: Anemia, unspecified: D64.9

## 2018-05-12 HISTORY — DX: Depression, unspecified: F32.A

## 2018-05-12 LAB — CBC
HCT: 33.1 % — ABNORMAL LOW (ref 36.0–46.0)
Hemoglobin: 10.7 g/dL — ABNORMAL LOW (ref 12.0–15.0)
MCH: 28.2 pg (ref 26.0–34.0)
MCHC: 32.3 g/dL (ref 30.0–36.0)
MCV: 87.1 fL (ref 80.0–100.0)
Platelets: 102 10*3/uL — ABNORMAL LOW (ref 150–400)
RBC: 3.8 MIL/uL — ABNORMAL LOW (ref 3.87–5.11)
RDW: 13.8 % (ref 11.5–15.5)
WBC: 7 10*3/uL (ref 4.0–10.5)
nRBC: 0 % (ref 0.0–0.2)

## 2018-05-12 NOTE — Progress Notes (Signed)
Obstetric H&P   Chief Complaint: C-section 05/13/2018  Prenatal Care Provider: [facility]  History of Present Illness: 28 y.o. [id number] [redacted]w[redacted]d by 05/20/2018, by Last Menstrual Period presenting today to schedule repeat C-section.  +FM, no LOF, no VB, no ctx.  Pregnancy history notable for PPH following last C-section as well as gestational thrombocytopenia.  Gestational thrombocytopenia this pregnancy as well.   Pregravid weight 204 lb (92.5 kg) Total Weight Gain -2 lb (-0.907 kg)  Pregnancy #5 Problems (from 08/13/17 to present)    Problem Noted Resolved   H/O cesarean section complicating pregnancy 04/28/2018 by Conard Novak, MD No   History of postpartum hemorrhage, currently pregnant 04/28/2018 by Conard Novak, MD No   Overview Signed 04/28/2018 11:15 AM by Conard Novak, MD    - after c-section in 2016, requiring Bakri Balloon.      Genital herpes affecting pregnancy 04/28/2018 by Conard Novak, MD No   Overview Signed 04/28/2018 11:34 AM by Conard Novak, MD    Valtrex @ 36 wks      Thrombocytopenia affecting pregnancy, antepartum (HCC) 08/15/2014 by Nadara Mustard, MD No   Overview Addendum 05/01/2018 12:54 PM by Farrel Conners, CNM    106 at 28 weeks []  recheck at 36 weeks (h/o platelet count of 66)      Supervision of high risk pregnancy, antepartum 08/13/2014 by Ward, Elenora Fender, MD No   Overview Addendum 03/25/2018  4:08 PM by Oswaldo Conroy, CNM      [facility] Prenatal Labs  Dating L=7 Blood type: B/Positive/-- (09/12 1442)   Genetic Screen NIPS: Negative XY Antibody:Negative (09/12 1442)  Anatomic [place] Complete 1/16, normal except for [facility] Rubella: 4.49 (09/12 1442) Varicella: Immune  GTT 28 wk:   110   RPR: Non Reactive (09/12 1442)   Rhogam N/A HBsAg: Negative (09/12 1442)   TDaP vaccine 03/25/2018                      HIV: Non Reactive (09/12 1442)   Flu Shot Declines                            GBS: negative  Contraception IUD Pap:  09/24/2017, NILM and HPV Negative  CBB     CS/VBAC C-section   Baby Food    Support Person  Jess Barters               Review of Systems: 10 point review of systems negative unless otherwise noted in HPI  Past Medical History: Past Medical History:  Diagnosis Date  . Genital herpes   . Gestational thrombocytopenia (HCC)   . Postpartum hemorrhage 08/2014  . Stillbirth 04/26/2015   [redacted] weeks gestation    Past Surgical History: Past Surgical History:  Procedure Laterality Date  . APPENDECTOMY     Pt reports appendectomy in 2006  . CESAREAN SECTION N/A 08/14/2014   Procedure: CESAREAN SECTION;  Surgeon: Vena Austria, MD;  Location: [facility] ORS;  Service: Obstetrics;  Laterality: N/A;    Past Obstetric History: #: 1, Date: 04/06/10, Sex: Female, Weight: 6 lb 14 oz (3.118 kg), GA: [redacted]w[redacted]d, Delivery: Vaginal, Spontaneous, Apgar1: None, Apgar5: None, Living: Living, Birth Comments: None  #: 2, Date: 08/14/14, Sex: Female, Weight: 7 lb 4.1 oz (3.29 kg), GA: [redacted]w[redacted]d, Delivery: C-Section, Low Transverse, Apgar1: 8, Apgar5: 9, Living: Living, Birth Comments: none  #: 3, Date: 04/26/15, Sex: Female, Weight: None, GA:  7434w0d, Delivery: Vaginal, Spontaneous, Apgar1: None, Apgar5: None, Living: Fetal Demise, Birth Comments: None  #: 4, Date: 05/2017, Sex: None, Weight: None, GA: None, Delivery: None, Apgar1: None, Apgar5: None, Living: None, Birth Comments: None  #: 5, Date: None, Sex: None, Weight: None, GA: None, Delivery: None, Apgar1: None, Apgar5: None, Living: None, Birth Comments: None   Past Gynecologic History:  Family History: No family history on file.  Social History: Social History   Socioeconomic History  . Marital status: Single    Spouse name: Not on file  . Number of children: Not on file  . Years of education: Not on file  . Highest education level: Not on file  Occupational History  . Not on file  Social Needs  . Financial resource strain: Not on file  . Food  insecurity:    Worry: Not on file    Inability: Not on file  . Transportation needs:    Medical: Not on file    Non-medical: Not on file  Tobacco Use  . Smoking status: Never Smoker  . Smokeless tobacco: Never Used  Substance and Sexual Activity  . Alcohol use: No  . Drug use: No  . Sexual activity: Yes    Birth control/protection: I.U.D.  Lifestyle  . Physical activity:    Days per week: Not on file    Minutes per session: Not on file  . Stress: Not on file  Relationships  . Social connections:    Talks on phone: Not on file    Gets together: Not on file    Attends religious service: Not on file    Active member of club or organization: Not on file    Attends meetings of clubs or organizations: Not on file    Relationship status: Not on file  . Intimate partner violence:    Fear of current or ex partner: Not on file    Emotionally abused: Not on file    Physically abused: Not on file    Forced sexual activity: Not on file  Other Topics Concern  . Not on file  Social History Narrative  . Not on file    Medications: Prior to Admission medications   Medication Sig Start Date End Date Taking? Authorizing Provider  acetaminophen (TYLENOL) 325 MG tablet Take 2 tablets (650 mg total) by mouth every 4 (four) hours as needed (for pain scale < 4). 08/18/14  Yes Ward, Elenora Fenderhelsea C, MD  Ferrous Sulfate (IRON) 28 MG TABS Take 28 mg by mouth daily.    Yes [provider]  Prenat-FeFum-FePo-FA-Omega 3 (TARON-C DHA) 53.5-38-1 MG CAPS Take 1 tablet by mouth daily. 09/17/17  Yes [provider]  valACYclovir (VALTREX) 500 MG tablet Take 1 tablet (500 mg total) by mouth 2 (two) times daily for 30 days. 04/28/18 05/28/18 Yes Conard NovakJackson, Stephen D, MD  zolpidem (AMBIEN) 5 MG tablet Take 1 tablet (5 mg total) by mouth at bedtime as needed for sleep. 05/01/18  Yes Farrel ConnersGutierrez, Colleen, CNM    Allergies: Allergies  Allergen Reactions  . Cherry Hives  . Mushroom Extract Complex  Swelling    Physical Exam: Vitals: Blood pressure (!) 128/58, pulse 92, height 5\' 5"  (1.651 m), weight 204 lb (92.5 kg), last menstrual period 08/13/2017, unknown if currently breastfeeding.  FHT:140  General: NAD HEENT: normocephalic, anicteric Pulmonary: No increased work of breathing Cardiovascular: RRR, distal pulses 2+ Abdomen: Gravid, non-tender Leopolds: vertex Genitourinary: deferred Extremities: no edema, erythema, or tenderness Neurologic: Grossly intact Psychiatric: mood appropriate,  affect full  Labs: No results found for this or any previous visit (from the past 24 hour(s)).  Assessment: 28 y.o. [id number] [redacted]w[redacted]d by 05/20/2018, by Last Menstrual Period presenting for scheduled cesarean section  Plan: 1) The patient was counseled regarding risk and benefits to proceeding with Cesarean section to expedite delivery.  Risk of cesarean section were discussed including risk of bleeding and need for potential intraoperative or postoperative blood transfusion with a rate of approximately 5% quoted for all Cesarean sections, risk of injury to adjacent organs including but not limited to bowl and bladder, the need for additional surgical procedures to address such injuries, and the risk of infection.    2) Fetus - positive FHT  3) PNL - Blood type --/--/B POS (04/18 1443) / Anti-bodyscreen NEG (04/18 1443) / Rubella 4.49 (09/12 1442) / Varicella Immune/ RPR Non Reactive (04/18 1443) / HBsAg Negative (09/12 1442) / HIV Non Reactive (03/12 1549) / 1-hr OGTT 110 / GBS Negative (04/15 1630)  4) Immunization History -  Immunization History  Administered Date(s) Administered  . Influenza,inj,Quad PF,6+ Mos 10/30/2017  . Tdap 03/25/2018    5) Gestational thrombocytopenia - platelets and blood on stand by  6) Disposition - pending delivery  Vena Austria, MD, [facility], [facility] 05/12/2018, 8:14 AM

## 2018-05-12 NOTE — Patient Instructions (Signed)
Your procedure is scheduled on: 05-13-18 THURSDAY Report to [facility] FOR SCREENING-THEN PROCEED TO LABOR AND DELIVERY ON 3RD FLOOR-ARRIVE AT 5:30 AM  Remember: Instructions that are not followed completely may result in serious medical risk, up to and including death, or upon the discretion of your surgeon and anesthesiologist your surgery may need to be rescheduled.    _x___ 1. Do not eat food after midnight the night before your procedure. NO GUM OR CANDY AFTER MIDNIGHT.  You may drink clear liquids up to 2 hours before you are scheduled to arrive at the hospital for your procedure.  Do not drink clear liquids within 2 hours of your scheduled arrival to the hospital.  Clear liquids include  --Water or Apple juice without pulp  --Clear carbohydrate beverage such as ClearFast or Gatorade  --Black Coffee or Clear Tea (No milk, no creamers, do not add anything to the coffee or Tea   ____Ensure clear carbohydrate drink on the way to the hospital for bariatric patients  ____Ensure clear carbohydrate drink 3 hours before surgery for Dr Rutherford Nail patients if physician instructed.     __x__ 2. No Alcohol for 24 hours before or after surgery.   __x__3. No Smoking or e-cigarettes for 24 prior to surgery.  Do not use any chewable tobacco products for at least 6 hour prior to surgery   ____  4. Bring all medications with you on the day of surgery if instructed.    __x__ 5. Notify your doctor if there is any change in your medical condition     (cold, fever, infections).    x___6. On the morning of surgery brush your teeth with toothpaste and water.  You may rinse your mouth with mouth wash if you wish.  Do not swallow any toothpaste or mouthwash.   Do not wear jewelry, make-up, hairpins, clips or nail polish.  Do not wear lotions, powders, or perfumes. You may wear deodorant.  Do not shave 48 hours prior to surgery. Men may shave face and neck.  Do not bring valuables to the hospital.     [facility] is not responsible for any belongings or valuables.               Contacts, dentures or bridgework may not be worn into surgery.  Leave your suitcase in the car. After surgery it may be brought to your room.  For patients admitted to the hospital, discharge time is determined by your treatment team.  _  Patients discharged the day of surgery will not be allowed to drive home.  You will need someone to drive you home and stay with you the night of your procedure.    Please read over the following fact sheets that you were given:   [facility] Preparing for Surgery   ____ Take anti-hypertensive listed below, cardiac, seizure, asthma, anti-reflux and psychiatric medicines. These include:  1. NONE  2.  3.  4.  5.  6.  ____Fleets enema or Magnesium Citrate as directed.   _x___ Use CHG Soap or sage wipes as directed on instruction sheet-AVOID NIPPLE AREA  ____ Use inhalers on the day of surgery and bring to hospital day of surgery  ____ Stop Metformin and Janumet 2 days prior to surgery.    ____ Take 1/2 of usual insulin dose the night before surgery and none on the morning surgery.   ____ Follow recommendations from Cardiologist, Pulmonologist or PCP regarding stopping Aspirin, Coumadin, Plavix ,Eliquis, Effient, or Pradaxa, and  Pletal.  X____Stop Anti-inflammatories such as Advil, Aleve, Ibuprofen, Motrin, Naproxen, Naprosyn, Goodies powders or aspirin products. OK to take Tylenol    _x___ Stop supplements until after surgery.     ____ Bring C-Pap to the hospital.

## 2018-05-13 ENCOUNTER — Inpatient Hospital Stay: Payer: Medicaid Other | Admitting: Anesthesiology

## 2018-05-13 ENCOUNTER — Inpatient Hospital Stay
Admission: RE | Admit: 2018-05-13 | Payer: Medicaid Other | Source: Home / Self Care | Admitting: Obstetrics and Gynecology

## 2018-05-13 ENCOUNTER — Inpatient Hospital Stay
Admission: RE | Admit: 2018-05-13 | Discharge: 2018-05-15 | DRG: 787 | Disposition: A | Discharge status: Home / Self Care | Payer: Medicaid Other | Attending: Obstetrics and Gynecology | Admitting: Obstetrics and Gynecology

## 2018-05-13 ENCOUNTER — Encounter
Admission: RE | Disposition: A | Discharge status: Home / Self Care | Payer: Self-pay | Source: Home / Self Care | Attending: Obstetrics and Gynecology

## 2018-05-13 DIAGNOSIS — D6959 Other secondary thrombocytopenia: Secondary | ICD-10-CM | POA: Diagnosis present

## 2018-05-13 DIAGNOSIS — D62 Acute posthemorrhagic anemia: Secondary | ICD-10-CM | POA: Diagnosis not present

## 2018-05-13 DIAGNOSIS — O9912 Other diseases of the blood and blood-forming organs and certain disorders involving the immune mechanism complicating childbirth: Secondary | ICD-10-CM | POA: Diagnosis present

## 2018-05-13 DIAGNOSIS — O9081 Anemia of the puerperium: Secondary | ICD-10-CM | POA: Diagnosis not present

## 2018-05-13 DIAGNOSIS — O34211 Maternal care for low transverse scar from previous cesarean delivery: Principal | ICD-10-CM | POA: Diagnosis present

## 2018-05-13 DIAGNOSIS — A6000 Herpesviral infection of urogenital system, unspecified: Secondary | ICD-10-CM | POA: Diagnosis present

## 2018-05-13 DIAGNOSIS — Z3A39 39 weeks gestation of pregnancy: Secondary | ICD-10-CM

## 2018-05-13 DIAGNOSIS — O9832 Other infections with a predominantly sexual mode of transmission complicating childbirth: Secondary | ICD-10-CM | POA: Diagnosis present

## 2018-05-13 DIAGNOSIS — O34219 Maternal care for unspecified type scar from previous cesarean delivery: Secondary | ICD-10-CM | POA: Diagnosis present

## 2018-05-13 LAB — PROTIME-INR
INR: 1.2 (ref 0.8–1.2)
Prothrombin Time: 15 seconds (ref 11.4–15.2)

## 2018-05-13 LAB — PLATELET COUNT: Platelets: 114 10*3/uL — ABNORMAL LOW (ref 150–400)

## 2018-05-13 SURGERY — Surgical Case
Anesthesia: Spinal | Site: Abdomen

## 2018-05-13 MED ORDER — LACTATED RINGERS IV SOLN
Freq: Once | INTRAVENOUS | Status: AC
  Administered 2018-05-13: 06:00:00 via INTRAVENOUS

## 2018-05-13 MED ORDER — MISOPROSTOL 200 MCG PO TABS
ORAL_TABLET | ORAL | Status: AC
  Filled 2018-05-13: qty 5

## 2018-05-13 MED ORDER — NALBUPHINE HCL 10 MG/ML IJ SOLN: 5.0000 mg | INTRAMUSCULAR | Status: DC | PRN

## 2018-05-13 MED ORDER — NALBUPHINE HCL 10 MG/ML IJ SOLN: 5.0000 mg | Freq: Once | INTRAMUSCULAR | Status: DC | PRN

## 2018-05-13 MED ORDER — OXYTOCIN 40 UNITS IN NORMAL SALINE INFUSION - SIMPLE MED
INTRAVENOUS | Status: AC
  Filled 2018-05-13: qty 1000

## 2018-05-13 MED ORDER — MORPHINE SULFATE (PF) 0.5 MG/ML IJ SOLN
INTRAMUSCULAR | Status: DC | PRN
  Administered 2018-05-13: .1 mg via INTRATHECAL

## 2018-05-13 MED ORDER — CARBOPROST TROMETHAMINE 250 MCG/ML IM SOLN
INTRAMUSCULAR | Status: AC
  Filled 2018-05-13: qty 1

## 2018-05-13 MED ORDER — MEPERIDINE HCL 25 MG/ML IJ SOLN: 6.2500 mg | INTRAMUSCULAR | Status: DC | PRN

## 2018-05-13 MED ORDER — OXYTOCIN 40 UNITS IN NORMAL SALINE INFUSION - SIMPLE MED
INTRAVENOUS | Status: DC | PRN
  Administered 2018-05-13: 1000 mL via INTRAVENOUS

## 2018-05-13 MED ORDER — CEFAZOLIN SODIUM-DEXTROSE 2-4 GM/100ML-% IV SOLN
2.0000 g | INTRAVENOUS | Status: AC
  Administered 2018-05-13: 2 g via INTRAVENOUS
  Filled 2018-05-13: qty 100

## 2018-05-13 MED ORDER — KETOROLAC TROMETHAMINE 30 MG/ML IJ SOLN
30.0000 mg | Freq: Four times a day (QID) | INTRAMUSCULAR | Status: DC
  Administered 2018-05-13 – 2018-05-14 (×3): 30 mg via INTRAVENOUS
  Filled 2018-05-13 (×3): qty 1

## 2018-05-13 MED ORDER — SIMETHICONE 80 MG PO CHEW
80.0000 mg | CHEWABLE_TABLET | ORAL | Status: DC | PRN
  Administered 2018-05-15: 80 mg via ORAL
  Filled 2018-05-13: qty 1

## 2018-05-13 MED ORDER — BUPIVACAINE IN DEXTROSE 0.75-8.25 % IT SOLN
INTRATHECAL | Status: AC
  Filled 2018-05-13: qty 2

## 2018-05-13 MED ORDER — SOD CITRATE-CITRIC ACID 500-334 MG/5ML PO SOLN
30.0000 mL | ORAL | Status: AC
  Administered 2018-05-13: 09:00:00 30 mL via ORAL
  Filled 2018-05-13: qty 15

## 2018-05-13 MED ORDER — FENTANYL CITRATE (PF) 100 MCG/2ML IJ SOLN
INTRAMUSCULAR | Status: AC
  Filled 2018-05-13: qty 2

## 2018-05-13 MED ORDER — KETOROLAC TROMETHAMINE 30 MG/ML IJ SOLN: 30.0000 mg | Freq: Four times a day (QID) | INTRAMUSCULAR | Status: DC

## 2018-05-13 MED ORDER — WITCH HAZEL-GLYCERIN EX PADS: 1.0000 "application " | MEDICATED_PAD | CUTANEOUS | Status: DC | PRN

## 2018-05-13 MED ORDER — BUPIVACAINE IN DEXTROSE 0.75-8.25 % IT SOLN
INTRATHECAL | Status: DC | PRN
  Administered 2018-05-13: 1.6 mL via INTRATHECAL

## 2018-05-13 MED ORDER — ONDANSETRON HCL 4 MG/2ML IJ SOLN: 4.0000 mg | Freq: Three times a day (TID) | INTRAMUSCULAR | Status: DC | PRN

## 2018-05-13 MED ORDER — ACETAMINOPHEN 325 MG PO TABS
650.0000 mg | ORAL_TABLET | Freq: Four times a day (QID) | ORAL | Status: DC
  Administered 2018-05-13 – 2018-05-14 (×3): 650 mg via ORAL
  Filled 2018-05-13 (×3): qty 2

## 2018-05-13 MED ORDER — SIMETHICONE 80 MG PO CHEW: 80.0000 mg | CHEWABLE_TABLET | ORAL | Status: DC

## 2018-05-13 MED ORDER — DIBUCAINE (PERIANAL) 1 % EX OINT: 1.0000 "application " | TOPICAL_OINTMENT | CUTANEOUS | Status: DC | PRN

## 2018-05-13 MED ORDER — SODIUM CHLORIDE 0.9 % IV SOLN
INTRAVENOUS | Status: DC | PRN
  Administered 2018-05-13: 50 ug/min via INTRAVENOUS

## 2018-05-13 MED ORDER — SIMETHICONE 80 MG PO CHEW
80.0000 mg | CHEWABLE_TABLET | Freq: Three times a day (TID) | ORAL | Status: DC
  Administered 2018-05-13 – 2018-05-15 (×6): 80 mg via ORAL
  Filled 2018-05-13 (×6): qty 1

## 2018-05-13 MED ORDER — OXYCODONE-ACETAMINOPHEN 5-325 MG PO TABS
1.0000 | ORAL_TABLET | ORAL | Status: DC | PRN
  Administered 2018-05-14 – 2018-05-15 (×4): 1 via ORAL
  Filled 2018-05-13 (×4): qty 1

## 2018-05-13 MED ORDER — COCONUT OIL OIL
1.0000 "application " | TOPICAL_OIL | Status: DC | PRN
  Administered 2018-05-14: 1 via TOPICAL
  Filled 2018-05-13: qty 120

## 2018-05-13 MED ORDER — NALOXONE HCL 0.4 MG/ML IJ SOLN: 0.4000 mg | INTRAMUSCULAR | Status: DC | PRN

## 2018-05-13 MED ORDER — FENTANYL CITRATE (PF) 100 MCG/2ML IJ SOLN
INTRAMUSCULAR | Status: DC | PRN
  Administered 2018-05-13: 15 ug via INTRATHECAL

## 2018-05-13 MED ORDER — BUPIVACAINE HCL 0.5 % IJ SOLN
20.0000 mL | INTRAMUSCULAR | Status: DC
  Filled 2018-05-13: qty 20

## 2018-05-13 MED ORDER — IBUPROFEN 800 MG PO TABS
800.0000 mg | ORAL_TABLET | Freq: Three times a day (TID) | ORAL | Status: DC
  Administered 2018-05-14 – 2018-05-15 (×4): 800 mg via ORAL
  Filled 2018-05-13 (×4): qty 1

## 2018-05-13 MED ORDER — BUPIVACAINE 0.25 % ON-Q PUMP DUAL CATH 400 ML
400.0000 mL | INJECTION | Status: DC
  Filled 2018-05-13: qty 400

## 2018-05-13 MED ORDER — ONDANSETRON HCL 4 MG/2ML IJ SOLN
INTRAMUSCULAR | Status: AC
  Filled 2018-05-13: qty 2

## 2018-05-13 MED ORDER — OXYCODONE HCL 5 MG PO TABS: 10.0000 mg | ORAL_TABLET | ORAL | Status: DC | PRN

## 2018-05-13 MED ORDER — METHYLERGONOVINE MALEATE 0.2 MG/ML IJ SOLN
INTRAMUSCULAR | Status: AC
  Filled 2018-05-13: qty 1

## 2018-05-13 MED ORDER — DIPHENHYDRAMINE HCL 25 MG PO CAPS: 25.0000 mg | ORAL_CAPSULE | Freq: Four times a day (QID) | ORAL | Status: DC | PRN

## 2018-05-13 MED ORDER — OXYTOCIN 40 UNITS IN NORMAL SALINE INFUSION - SIMPLE MED
2.5000 [IU]/h | INTRAVENOUS | Status: DC
  Administered 2018-05-13: 2.5 [IU]/h via INTRAVENOUS
  Filled 2018-05-13: qty 1000

## 2018-05-13 MED ORDER — DIPHENHYDRAMINE HCL 25 MG PO CAPS: 25.0000 mg | ORAL_CAPSULE | ORAL | Status: DC | PRN

## 2018-05-13 MED ORDER — SODIUM CHLORIDE 0.9% FLUSH: 3.0000 mL | INTRAVENOUS | Status: DC | PRN

## 2018-05-13 MED ORDER — OXYCODONE HCL 5 MG PO TABS: 5.0000 mg | ORAL_TABLET | ORAL | Status: DC | PRN

## 2018-05-13 MED ORDER — DIPHENHYDRAMINE HCL 50 MG/ML IJ SOLN: 12.5000 mg | INTRAMUSCULAR | Status: DC | PRN

## 2018-05-13 MED ORDER — NALBUPHINE HCL 10 MG/ML IJ SOLN
5.0000 mg | INTRAMUSCULAR | Status: DC | PRN
  Administered 2018-05-13: 22:00:00 5 mg via INTRAVENOUS
  Filled 2018-05-13: qty 1

## 2018-05-13 MED ORDER — 0.9 % SODIUM CHLORIDE (POUR BTL) OPTIME
TOPICAL | Status: DC | PRN
  Administered 2018-05-13: 1000 mL

## 2018-05-13 MED ORDER — BUPIVACAINE HCL (PF) 0.5 % IJ SOLN
INTRAMUSCULAR | Status: AC
  Filled 2018-05-13: qty 30

## 2018-05-13 MED ORDER — SENNOSIDES-DOCUSATE SODIUM 8.6-50 MG PO TABS
2.0000 | ORAL_TABLET | ORAL | Status: DC
  Administered 2018-05-14 – 2018-05-15 (×2): 2 via ORAL
  Filled 2018-05-13 (×2): qty 2

## 2018-05-13 MED ORDER — ONDANSETRON HCL 4 MG/2ML IJ SOLN
INTRAMUSCULAR | Status: DC | PRN
  Administered 2018-05-13: 4 mg via INTRAVENOUS

## 2018-05-13 MED ORDER — PRENATAL MULTIVITAMIN CH
1.0000 | ORAL_TABLET | Freq: Every day | ORAL | Status: DC
  Administered 2018-05-14 – 2018-05-15 (×2): 1 via ORAL
  Filled 2018-05-13 (×2): qty 1

## 2018-05-13 MED ORDER — MORPHINE SULFATE (PF) 0.5 MG/ML IJ SOLN
INTRAMUSCULAR | Status: AC
  Filled 2018-05-13: qty 10

## 2018-05-13 MED ORDER — LACTATED RINGERS IV SOLN
INTRAVENOUS | Status: DC
  Administered 2018-05-14 (×2): via INTRAVENOUS

## 2018-05-13 MED ORDER — BUPIVACAINE HCL (PF) 0.5 % IJ SOLN
INTRAMUSCULAR | Status: DC | PRN
  Administered 2018-05-13: 10 mL

## 2018-05-13 MED ORDER — LACTATED RINGERS IV SOLN
INTRAVENOUS | Status: DC | PRN
  Administered 2018-05-13: 10:00:00 via INTRAVENOUS

## 2018-05-13 MED ORDER — MENTHOL 3 MG MT LOZG
1.0000 | LOZENGE | OROMUCOSAL | Status: DC | PRN
  Filled 2018-05-13: qty 9

## 2018-05-13 MED ORDER — SOD CITRATE-CITRIC ACID 500-334 MG/5ML PO SOLN
ORAL | Status: AC
  Filled 2018-05-13: qty 30

## 2018-05-13 MED ORDER — PHENYLEPHRINE HCL (PRESSORS) 10 MG/ML IV SOLN
INTRAVENOUS | Status: DC | PRN
  Administered 2018-05-13: 100 ug via INTRAVENOUS

## 2018-05-13 SURGICAL SUPPLY — 34 items
BAG COUNTER SPONGE EZ (MISCELLANEOUS) ×2 IMPLANT
CANISTER SUCT 3000ML PPV (MISCELLANEOUS) ×3 IMPLANT
CATH KIT ON-Q SILVERSOAK 5IN (CATHETERS) ×6 IMPLANT
CHLORAPREP W/TINT 26 (MISCELLANEOUS) ×6 IMPLANT
CLOSURE WOUND 1/2 X4 (GAUZE/BANDAGES/DRESSINGS) ×2
COUNTER SPONGE BAG EZ (MISCELLANEOUS) ×1
DERMABOND ADVANCED (GAUZE/BANDAGES/DRESSINGS) ×2
DERMABOND ADVANCED .7 DNX12 (GAUZE/BANDAGES/DRESSINGS) ×1 IMPLANT
DRSG OPSITE POSTOP 4X10 (GAUZE/BANDAGES/DRESSINGS) ×3 IMPLANT
DRSG TEGADERM 4X4.75 (GAUZE/BANDAGES/DRESSINGS) ×6 IMPLANT
DRSG TELFA 3X8 NADH (GAUZE/BANDAGES/DRESSINGS) ×3 IMPLANT
ELECT CAUTERY BLADE 6.4 (BLADE) ×3 IMPLANT
ELECT REM PT RETURN 9FT ADLT (ELECTROSURGICAL) ×3
ELECTRODE REM PT RTRN 9FT ADLT (ELECTROSURGICAL) ×1 IMPLANT
GAUZE SPONGE 4X4 12PLY STRL (GAUZE/BANDAGES/DRESSINGS) ×3 IMPLANT
GLOVE BIO SURGEON STRL SZ7 (GLOVE) ×3 IMPLANT
GLOVE INDICATOR 7.5 STRL GRN (GLOVE) ×3 IMPLANT
GOWN STRL REUS W/ TWL LRG LVL3 (GOWN DISPOSABLE) ×3 IMPLANT
GOWN STRL REUS W/TWL LRG LVL3 (GOWN DISPOSABLE) ×6
NS IRRIG 1000ML POUR BTL (IV SOLUTION) ×3 IMPLANT
PACK C SECTION AR (MISCELLANEOUS) ×3 IMPLANT
PAD OB MATERNITY 4.3X12.25 (PERSONAL CARE ITEMS) ×3 IMPLANT
PAD PREP 24X41 OB/GYN DISP (PERSONAL CARE ITEMS) ×3 IMPLANT
PENCIL SMOKE ULTRAEVAC 22 CON (MISCELLANEOUS) ×3 IMPLANT
STRIP CLOSURE SKIN 1/2X4 (GAUZE/BANDAGES/DRESSINGS) ×4 IMPLANT
SUT MNCRL 4-0 (SUTURE) ×2
SUT MNCRL 4-0 27XMFL (SUTURE) ×1
SUT MNCRL AB 4-0 PS2 18 (SUTURE) ×3 IMPLANT
SUT PDS AB 1 TP1 96 (SUTURE) ×3 IMPLANT
SUT VIC AB 0 CTX 36 (SUTURE) ×4
SUT VIC AB 0 CTX36XBRD ANBCTRL (SUTURE) ×2 IMPLANT
SUT VIC AB 1 CT1 36 (SUTURE) ×3 IMPLANT
SUT VIC AB 2-0 CT1 36 (SUTURE) ×3 IMPLANT
SUTURE MNCRL 4-0 27XMF (SUTURE) ×1 IMPLANT

## 2018-05-13 NOTE — Anesthesia Preprocedure Evaluation (Signed)
Anesthesia Evaluation  Patient identified by MRN, date of birth, ID band Patient awake    Reviewed: Allergy & Precautions, NPO status , Patient's Chart, lab work & pertinent test results  History of Anesthesia Complications Negative for: history of anesthetic complications  Airway Mallampati: III  TM Distance: >3 FB Neck ROM: Full    Dental no notable dental hx.    Pulmonary neg pulmonary ROS, neg sleep apnea, neg COPD,    breath sounds clear to auscultation- rhonchi (-) wheezing      Cardiovascular Exercise Tolerance: Good (-) hypertension(-) CAD, (-) Past MI, (-) Cardiac Stents and (-) CABG  Rhythm:Regular Rate:Normal - Systolic murmurs and - Diastolic murmurs    Neuro/Psych neg Seizures PSYCHIATRIC DISORDERS Depression negative neurological ROS     GI/Hepatic negative GI ROS, Neg liver ROS,   Endo/Other  negative endocrine ROSneg diabetes  Renal/GU negative Renal ROS     Musculoskeletal negative musculoskeletal ROS (+)   Abdominal (+) + obese, Gravid abdomen  Peds  Hematology  (+) anemia , Gestational thrombocytopenia   Anesthesia Other Findings Past Medical History: No date: Anemia No date: Depression No date: Genital herpes No date: Gestational thrombocytopenia (HCC) 08/2014: Postpartum hemorrhage 04/26/2015: Stillbirth     Comment:  [redacted] weeks gestation   Reproductive/Obstetrics (+) Pregnancy                             Lab Results  Component Value Date   WBC 7.0 05/12/2018   HGB 10.7 (L) 05/12/2018   HCT 33.1 (L) 05/12/2018   MCV 87.1 05/12/2018   PLT 114 (L) 05/13/2018    Anesthesia Physical Anesthesia Plan  ASA: II  Anesthesia Plan: Spinal   Post-op Pain Management:    Induction:   PONV Risk Score and Plan: 2 and Ondansetron  Airway Management Planned: Natural Airway  Additional Equipment:   Intra-op Plan:   Post-operative Plan:   Informed Consent: I  have reviewed the patients History and Physical, chart, labs and discussed the procedure including the risks, benefits and alternatives for the proposed anesthesia with the patient or authorized representative who has indicated his/her understanding and acceptance.     Dental advisory given  Plan Discussed with: CRNA and Anesthesiologist  Anesthesia Plan Comments:         Anesthesia Quick Evaluation

## 2018-05-13 NOTE — Transfer of Care (Signed)
Immediate Anesthesia Transfer of Care Note  Patient: Marissa Andrews  Procedure(s) Performed: CESAREAN SECTION (N/A Abdomen)  Patient Location: PACU  Anesthesia Type:spinal  Level of Consciousness: awake, alert  and oriented  Airway & Oxygen Therapy: Patient Spontanous Breathing  Post-op Assessment: Report given to RN and Post -op Vital signs reviewed and stable  Post vital signs: Reviewed and stable  Last Vitals:  Vitals Value Taken Time  BP 97/53 05/13/2018  1:49 PM  Temp 36.5 C 05/13/2018  1:49 PM  Pulse 74 05/13/2018  1:49 PM  Resp 14 05/13/2018  1:49 PM  SpO2 97 % 05/13/2018  1:49 PM    Last Pain:  Vitals:   05/13/18 1349  TempSrc:   PainSc: 0-No pain         Complications: No apparent anesthesia complications

## 2018-05-13 NOTE — Discharge Summary (Signed)
Obstetric Discharge Summary Reason for Admission: onset of labor and cesarean section Prenatal Procedures: none Intrapartum Procedures: cesarean: low cervical, transverse Postpartum Procedures: none Complications-Operative and Postpartum: none Hemoglobin  Date Value Ref Range Status  05/14/2018 8.9 (L) 12.0 - 15.0 g/dL Final  36/14/4315 40.0 11.1 - 15.9 g/dL Final   HCT  Date Value Ref Range Status  05/14/2018 27.3 (L) 36.0 - 46.0 % Final   Hematocrit  Date Value Ref Range Status  03/25/2018 32.4 (L) 34.0 - 46.6 % Final    Physical Exam:  General: alert, cooperative and no distress Lochia: appropriate Uterine Fundus: firm Incision: honeycomb dressing is C/D/I, On Q pump intact DVT Evaluation: No evidence of DVT seen on physical exam.  Discharge Diagnoses: Term Pregnancy-delivered  Discharge Information: Date: 05/15/2018 Activity: pelvic rest and no heavy lifting >10lbs for 6 weeks, and no driving for 2 weeks Diet: routine Allergies as of 05/15/2018      Reactions   Cherry Hives   Mushroom Extract Complex Swelling      Medication List    STOP taking these medications   acetaminophen 325 MG tablet Commonly known as:  Tylenol   zolpidem 5 MG tablet Commonly known as:  AMBIEN     TAKE these medications   Iron 28 MG Tabs Take 28 mg by mouth daily.   oxyCODONE 5 MG immediate release tablet Commonly known as:  Roxicodone Take 1 tablet (5 mg total) by mouth every 6 (six) hours as needed for up to 5 days for severe pain.   Taron-C DHA 53.5-38-1 MG Caps Take 1 tablet by mouth daily.   valACYclovir 500 MG tablet Commonly known as:  VALTREX Take 1 tablet (500 mg total) by mouth 2 (two) times daily for 30 days.       Condition: stable Discharge to: home Follow-up Information    Vena Austria, MD In 1 week.   Specialty:  Obstetrics and Gynecology Why:  For wound re-check Contact information: [address] [place] [address] [phone]            Newborn Data: Live born female  Birth Weight: 8 lb 7.5 oz (3840 g) APGAR: 9, 9  Newborn Delivery   Birth date/time:  05/13/2018 12:59:00 Delivery type:  C-Section, Low Transverse Trial of labor:  No C-section categorization:  Repeat   Home with mother.   Tresea Mall, CNM [facility] 05/15/2018, 12:44 PM

## 2018-05-13 NOTE — Anesthesia Procedure Notes (Signed)
Spinal  Patient location during procedure: OR Start time: 05/13/2018 12:37 PM End time: 05/13/2018 12:42 PM Staffing Anesthesiologist: Emmie Niemann, MD Resident/CRNA: Hedda Slade, CRNA Performed: resident/CRNA  Preanesthetic Checklist Completed: patient identified, site marked, surgical consent, pre-op evaluation, timeout performed, IV checked, risks and benefits discussed and monitors and equipment checked Spinal Block Patient position: sitting Prep: ChloraPrep Patient monitoring: heart rate, continuous pulse ox, blood pressure and cardiac monitor Approach: midline Location: L4-5 Injection technique: single-shot Needle Needle type: Introducer and Whitacre  Needle gauge: 24 G Needle length: 9 cm Additional Notes Negative paresthesia. Negative blood return. Positive free-flowing CSF. Expiration date of kit checked and confirmed. Patient tolerated procedure well, without complications.

## 2018-05-13 NOTE — Op Note (Signed)
Preoperative Diagnosis: 1) 28 y.o. [id number] at 3450w0d 2) History of prior cesarean section 3) Gestational thrombocytopenia  Postoperative Diagnosis: 1) 28 y.o. [id number] at 8550w0d 2) History of prior cesarean section 3) Gestational thrombocytopenia  Operation Performed: Repeat low transverse C-section via pfannenstiel skin incision  Indiciation:  Anesthesia: .Spinal  Primary Surgeon: Vena AustriaAndreas Ollie Esty, MD   Assistant:Dr. Annamarie MajorPaul Harris this surgery required a high level surgical assistant with none other readily available  Preoperative Antibiotics: 2g  Estimated Blood Loss: 625 mL  IV Fluids: 1200mL  Urine Output:: 75mL  Drains or Tubes: Foley to gravity drainage, ON-Q catheter system  Implants: none  Specimens Removed: none  Complications: none  Intraoperative Findings:  Normal tubes ovaries and uterus.  Delivery resulted in the birth of a liveborn female, APGAR (1 MIN): 9   APGAR (5 MINS): 9, weight 8lbs 7oz  Patient Condition:stable  Procedure in Detail:  Patient was taken to the operating room were she was administered regional anesthesia.  She was positioned in the supine position, prepped and draped in the  Usual sterile fashion.  Prior to proceeding with the case a time out was performed and the level of anesthetic was checked and noted to be adequate.  Utilizing the scalpel a pfannenstiel skin incision was made 2cm above the pubic symphysis utilizing the patient's pre-existing scar and carried down sharply to the the level of the rectus fascia.  The fascia was incised in the midline using the scalpel and then extended using mayo scissors.  The superior border of the rectus fascia was grasped with two Kocher clamps and the underlying rectus muscles were dissected of the fascia using blunt dissection.  The median raphae was incised using Mayo scissors.   The inferior border of the rectus fascia was dissected of the rectus muscles in a similar fashion.  The midline was  identified, the peritoneum was entered bluntly and expanded using manual tractions.  The uterus was noted to be in a none rotated position.  Next the bladder blade was placed retracting the bladder caudally.  A bladder flap was not created.  A low transverse incision was scored on the lower uterine segment.  The hysterotomy was entered bluntly using the operators finger.  The hysterotomy incision was extended using manual traction.  The operators hand was placed within the hysterotomy position noting the fetus to be within the OA position.  The vertex was grasped, flexed, brought to the incision, and delivered a traumatically using fundal pressure.  The remainder of the body delivered with ease.  The infant was suctioned, cord was clamped and cut before handing off to the awaiting neonatologist.  The placenta was delivered using manual extraction.  The uterus was exteriorized, wiped clean of clots and debris using two moist laps.  The hysterotomy was closed using a single layer closure of 0 Vicryl, with the first being a running locked, the second a vertical imbricating.  The uterus was returned to the abdomen.  The peritoneal gutters were wiped clean of clots and debris using two moist laps.  The hysterotomy incision was re-inspected noted to be hemostatic.  The rectus muscles were inspected noted to be hemostatic.  The superior border of the rectus fascia was grasped with a Kocher clamp.  The ON-Q trocars were then placed 4cm above the superior border of the incision and tunneled subfascially.  The introducers were removed and the catheters were threaded through the sleeves after which the sleeves were removed.  The fascia was closed using a  looped #1 PDS in a running fashion taking 1cm by 1cm bites.  The subcutaneous tissue was irrigated using warm saline, hemostasis achieved using the bovie.  The subcutaneous dead space was less than 3cm and was not closed.  The skin was closed using 4-0 Moncoryl in a  subcuticular fashion.  Sponge needle and instrument counts were corrects times two.  The patient tolerated the procedure well and was taken to the recovery room in stable condition.

## 2018-05-13 NOTE — Anesthesia Post-op Follow-up Note (Signed)
Anesthesia QCDR form completed.        

## 2018-05-13 NOTE — H&P (Signed)
Date of Initial H&P:05/13/2018  Repeat platelet count this morning 114K  History reviewed, patient examined, no change in status, stable for surgery.

## 2018-05-14 LAB — TYPE AND SCREEN
ABO/RH(D): B POS
Antibody Screen: NEGATIVE
Extend sample reason: UNDETERMINED
Unit division: 0
Unit division: 0
Unit division: 0

## 2018-05-14 LAB — BPAM RBC
Blood Product Expiration Date: 202005072359
Blood Product Expiration Date: 202005082359
Blood Product Expiration Date: 202005082359
Unit Type and Rh: 1700
Unit Type and Rh: 9500
Unit Type and Rh: 9500

## 2018-05-14 LAB — CBC
HCT: 27.3 % — ABNORMAL LOW (ref 36.0–46.0)
Hemoglobin: 8.9 g/dL — ABNORMAL LOW (ref 12.0–15.0)
MCH: 28.5 pg (ref 26.0–34.0)
MCHC: 32.6 g/dL (ref 30.0–36.0)
MCV: 87.5 fL (ref 80.0–100.0)
Platelets: 100 10*3/uL — ABNORMAL LOW (ref 150–400)
RBC: 3.12 MIL/uL — ABNORMAL LOW (ref 3.87–5.11)
RDW: 14 % (ref 11.5–15.5)
WBC: 9.2 10*3/uL (ref 4.0–10.5)
nRBC: 0 % (ref 0.0–0.2)

## 2018-05-14 LAB — PREPARE PLATELET PHERESIS: Unit division: 0

## 2018-05-14 LAB — PREPARE RBC (CROSSMATCH)

## 2018-05-14 LAB — BPAM PLATELET PHERESIS
Blood Product Expiration Date: 202005022359
Unit Type and Rh: 5100

## 2018-05-14 MED ORDER — FERROUS SULFATE 325 (65 FE) MG PO TABS
325.0000 mg | ORAL_TABLET | Freq: Two times a day (BID) | ORAL | Status: DC
  Administered 2018-05-14 – 2018-05-15 (×2): 325 mg via ORAL
  Filled 2018-05-14 (×2): qty 1

## 2018-05-14 NOTE — Clinical Social Work Maternal (Signed)
  CLINICAL SOCIAL WORK MATERNAL/CHILD NOTE  Patient Details  Name: Marissa Andrews MRN: [id number] Date of Birth: [birth year]  Date:  05/14/2018  Clinical Social Worker Initiating Note:  Shela Leff MSW,LCSW Date/Time: Initiated:  05/14/18/      Child's Name:      Biological Parents:  Mother   Need for Interpreter:  None   Reason for Referral:      Address:  [address] [place] [address]    Phone number:  [phone] (home)     Additional phone number: none  Household Members/Support Persons (HM/SP):       HM/SP Name Relationship DOB or Age  HM/SP -1        HM/SP -2        HM/SP -3        HM/SP -4        HM/SP -5        HM/SP -6        HM/SP -7        HM/SP -8          Natural Supports (not living in the home):  Friends   Professional Supports:     Employment:     Type of Work:     Education:      Homebound arranged:    Pensions consultant:      Other Resources:      Cultural/Religious Considerations Which May Impact Care:  none  Strengths:  Ability to meet basic needs , Compliance with medical plan , Home prepared for child    Psychotropic Medications:         Pediatrician:       Pediatrician List:   [facility]      Pediatrician Fax Number:    Risk Factors/Current Problems:      Cognitive State:  Alert , Able to Concentrate    Mood/Affect:  Flat , Calm    CSW Assessment: CSW consulted to see patient due to her depression score. CSW met with patient and she explained that she has depression and takes medication for depression. She stated that she does not follow up with a mental health professional. She reports having family support. She has all necessities for her newborn. She has two other children in the home with her and who are being watched by family while she is in the hospital. She does not have thoughts of self  harm currently and had weaned herself to a low dose on her depression medication prior to delivery. She believes that this has contributed to her feelings of sadness and tearfulness. She has set a goal to begin on the correct dosage.  CSW Plan/Description:  No Further Intervention Required/No Barriers to Discharge    Shela Leff, [place] 05/14/2018, 3:41 PM

## 2018-05-14 NOTE — Anesthesia Postprocedure Evaluation (Signed)
Anesthesia Post Note  Patient: Marissa Andrews  Procedure(s) Performed: CESAREAN SECTION (N/A Abdomen)  Patient location during evaluation: Mother Baby Anesthesia Type: Spinal Level of consciousness: awake and awake and alert Pain management: pain level controlled Vital Signs Assessment: post-procedure vital signs reviewed and stable Respiratory status: spontaneous breathing and nonlabored ventilation Cardiovascular status: blood pressure returned to baseline and stable Postop Assessment: no headache and no backache Anesthetic complications: no     Last Vitals:  Vitals:   05/14/18 0420 05/14/18 0653  BP: (!) 91/58 99/61  Pulse: 80 67  Resp: 18   Temp: 36.8 C   SpO2: 98% 100%    Last Pain:  Vitals:   05/14/18 0420  TempSrc: Oral  PainSc:                  Ginger Carne

## 2018-05-14 NOTE — Anesthesia Post-op Follow-up Note (Signed)
  Anesthesia Pain Follow-up Note  Patient: Marissa Andrews  Day #: 1  Date of Follow-up: 05/14/2018 Time: 7:41 AM  Last Vitals:  Vitals:   05/14/18 0420 05/14/18 0653  BP: (!) 91/58 99/61  Pulse: 80 67  Resp: 18   Temp: 36.8 C   SpO2: 98% 100%    Level of Consciousness: alert  Pain: none   Side Effects:None  Catheter Site Exam:clean, dry     Plan: D/C from anesthesia care at surgeon's request  [facility]

## 2018-05-14 NOTE — Lactation Note (Signed)
This note was copied from a baby's chart. Lactation Consultation Note  Patient Name: Boy Briauna Alvillar TOIZT'I Date: 05/14/2018   Mom desires to give baby formula, states she wanted to only breastfeed 24 hrs to give baby colostrum, offered to assist her with pumping and with breastfeeding, she states she has access to a pump at home she received at a baby shower but does not know the named, she prefers not to pump or breastfeed  at present time, if she changes her mind she states she will call, I wrote my name and phone no. on white board   Maternal Data    Feeding    LATCH Score                   Interventions    Lactation Tools Discussed/Used     Consult Status      Dyann Kief 05/14/2018, 1:54 PM

## 2018-05-14 NOTE — Progress Notes (Signed)
POD #1 Repeat LTCS/ Gestational thrombocytopenia Subjective:  Tired, falling asleep during conversation, but easily aroused. Answering questions appropriately Foley discontinued this AM, but has not voided yet. Tolerating regular diet. Passed a small amount of flatus. Pain well controlled with ON Q and Toradol/ Tylenol. Baby doing well.   Objective:  Blood pressure 106/65, pulse 77, temperature 98.9 F (37.2 C), temperature source Oral, resp. rate 18, height 5\' 5"  (1.651 m), weight 91.6 kg, last menstrual period 08/13/2017, SpO2 99 %,  U/O: 825 ml+ General: BF in NAD, sleepy Pulmonary: no increased work of breathing/ CTAB Heart: RRR with Grade III/VI systolic murmur best heard at LSB Abdomen: non-distended, non-tender, fundus firm at level of umbilicus-1FB, BS active Lochia: appropriate, small Incision: Honey comb dressing C+D+I Extremities: no edema, no erythema, some mild calf tenderness bilaterally-no cord palpated  Results for orders placed or performed during the hospital encounter of 05/13/18 (from the past 72 hour(s))  Platelet count     Status: Abnormal   Collection Time: 05/13/18  5:16 AM  Result Value Ref Range   Platelets 114 (L) 150 - 400 K/uL    Comment: Immature Platelet Fraction may be clinically indicated, consider ordering this additional test [id number] Performed at [facility], [address] [place], [place] [address]   Prepare RBC     Status: None   Collection Time: 05/13/18  6:00 AM  Result Value Ref Range   Order Confirmation      ORDER PROCESSED BY BLOOD BANK Performed at [facility], [address] [place], [place] [address]   Prepare Pheresed Platelets     Status: None (Preliminary result)   Collection Time: 05/13/18  8:37 AM  Result Value Ref Range   Unit Number [id number]    Blood Component Type PLTPHER LR2    Unit division 00    Status of Unit ALLOCATED    Transfusion Status      OK TO TRANSFUSE Performed at [facility], [address] [place], [place] [address]   Protime-INR     Status: None   Collection Time: 05/13/18  9:01 AM  Result Value Ref Range   Prothrombin Time 15.0 11.4 - 15.2 seconds   INR 1.2 0.8 - 1.2    Comment: (NOTE) INR goal varies based on device and disease states. Performed at [facility], [address] [place], [place] [address]   CBC     Status: Abnormal   Collection Time: 05/14/18  5:48 AM  Result Value Ref Range   WBC 9.2 4.0 - 10.5 K/uL    Comment: WHITE COUNT CONFIRMED ON SMEAR   RBC 3.12 (L) 3.87 - 5.11 MIL/uL   Hemoglobin 8.9 (L) 12.0 - 15.0 g/dL   HCT 92.9 (L) 24.4 - 62.8 %   MCV 87.5 80.0 - 100.0 fL   MCH 28.5 26.0 - 34.0 pg   MCHC 32.6 30.0 - 36.0 g/dL   RDW 63.8 17.7 - 11.6 %   Platelets 100 (L) 150 - 400 K/uL    Comment: Immature Platelet Fraction may be clinically indicated, consider ordering this additional test [id number]    nRBC 0.0 0.0 - 0.2 %    Comment: Performed at [facility], [address] [place], [place] [address]     Assessment:   28 y.o. [id number] postoperative day # 1 s/p repeat LTCS-stable  Trial of voiding  Ambulate with assist  Regular diet Gestational thrombocytopenia: platelet count 100K this AM-stable  Plan:  1) Chronic anemia worsened with acute blood loss from surgery - hemodynamically stable  - po ferrous sulfate/ vitamins  2) B POS/ RI/ VI  3) TDAP -given 03/25/2018  4) Breast & Bottle/Contraception-IUD  5) Disposition-probable discharge on POD 2 or POD #3  Farrel Connersolleen Josedaniel Haye, CNM

## 2018-05-15 MED ORDER — OXYCODONE HCL 5 MG PO TABS: 5.0000 mg | ORAL_TABLET | Freq: Four times a day (QID) | ORAL | 0 refills | Status: AC | PRN

## 2018-05-15 NOTE — Progress Notes (Signed)
Reviewed D/C instructions with pt and family. Pt verbalized understanding of teaching. Discharged to home via W/C. Pt to schedule f/u appt.  

## 2018-05-18 ENCOUNTER — Telehealth: Payer: Self-pay | Admitting: Obstetrics and Gynecology

## 2018-05-18 ENCOUNTER — Ambulatory Visit (INDEPENDENT_AMBULATORY_CARE_PROVIDER_SITE_OTHER): Payer: Medicaid Other | Admitting: Obstetrics and Gynecology

## 2018-05-18 ENCOUNTER — Other Ambulatory Visit: Payer: Self-pay

## 2018-05-18 ENCOUNTER — Encounter: Payer: Self-pay | Admitting: Obstetrics and Gynecology

## 2018-05-18 VITALS — BP 130/66 | HR 77 | Ht 65.0 in | Wt 196.0 lb

## 2018-05-18 DIAGNOSIS — Z4889 Encounter for other specified surgical aftercare: Secondary | ICD-10-CM

## 2018-05-18 NOTE — Telephone Encounter (Signed)
Patient scheduled 6/10 for Mirena insert with AMS

## 2018-05-18 NOTE — Progress Notes (Signed)
      Postoperative Follow-up Patient presents post op from RLTCS 1weeks ago for history of prior LTCS.  Subjective: Patient reports some improvement in her preop symptoms. Eating a regular diet without difficulty. Pain is controlled without any medications.  Activity: normal activities of daily living.  Objective: Blood pressure 130/66, pulse 77, height 5\' 5"  (1.651 m), weight 196 lb (88.9 kg), currently breastfeeding.  General: NAD Pulmonary: no increased work of breathing Abdomen: soft, non-tender, non-distended, incision(s) D/C/I Extremities: no edema Neurologic: normal gait    Admission on 05/13/2018, Discharged on 05/15/2018  Component Date Value Ref Range Status  . Platelets 05/13/2018 114* 150 - 400 K/uL Final   Comment: Immature Platelet Fraction may be clinically indicated, consider ordering this additional test [id number] Performed at [facility], [address] [place], [place] [address]   . Unit Number 05/13/2018 [id number]   Final  . Blood Component Type 05/13/2018 PLTPHER LI2   Final  . Unit division 05/13/2018 00   Final  . Status of Unit 05/13/2018 REL FROM [facility]   Final  . Transfusion Status 05/13/2018 OK TO TRANSFUSE   Final  . Order Confirmation 05/13/2018    Final                   Value:ORDER PROCESSED BY BLOOD BANK Performed at [facility], [address], [place], [place] [address]   . Blood Product Unit Number 05/13/2018 [id number]   Final  . Unit Type and Rh 05/13/2018 5100   Final  . Blood Product Expiration Date 05/13/2018 [id number]   Final  . Prothrombin Time 05/13/2018 15.0  11.4 - 15.2 seconds Final  . INR 05/13/2018 1.2  0.8 - 1.2 Final   Comment: (NOTE) INR goal varies based on device and disease states. Performed at [facility], [address] [place], [place] [address]   . WBC 05/14/2018 9.2  4.0 - 10.5 K/uL Final   WHITE COUNT CONFIRMED ON SMEAR  . RBC 05/14/2018 3.12* 3.87 - 5.11 MIL/uL  Final  . Hemoglobin 05/14/2018 8.9* 12.0 - 15.0 g/dL Final  . HCT 16/55/3748 27.3* 36.0 - 46.0 % Final  . MCV 05/14/2018 87.5  80.0 - 100.0 fL Final  . MCH 05/14/2018 28.5  26.0 - 34.0 pg Final  . MCHC 05/14/2018 32.6  30.0 - 36.0 g/dL Final  . RDW 27/07/8673 14.0  11.5 - 15.5 % Final  . Platelets 05/14/2018 100* 150 - 400 K/uL Final   Comment: Immature Platelet Fraction may be clinically indicated, consider ordering this additional test [id number]   . nRBC 05/14/2018 0.0  0.0 - 0.2 % Final   Performed at [facility], [address] [place], [place] [address]    Assessment: 28 y.o. s/p RLTCS stable  Plan: Patient has done well after surgery with no apparent complications.  I have discussed the post-operative course to date, and the expected progress moving forward.  The patient understands what complications to be concerned about.  I will see the patient in routine follow up, or sooner if needed.    Activity plan: No heavy lifting.  Interested in Mirena IUD  Return in about 5 weeks (around 06/22/2018) for 6 week postpartum and Mirena IUD insertion.    Vena Austria, MD, [facility], [facility] 05/18/2018, 4:56 PM

## 2018-05-27 NOTE — Telephone Encounter (Signed)
Noted. Will order to arrive by apt date/time. 

## 2018-05-28 ENCOUNTER — Other Ambulatory Visit: Payer: Self-pay | Admitting: Obstetrics and Gynecology

## 2018-05-28 ENCOUNTER — Telehealth: Payer: Self-pay

## 2018-05-28 MED ORDER — OXYCODONE-ACETAMINOPHEN 5-325 MG PO TABS: 1.0000 | ORAL_TABLET | ORAL | 0 refills | Status: DC | PRN

## 2018-05-28 NOTE — Telephone Encounter (Signed)
Pt calling for a refill on her pain medication from c-section.

## 2018-06-23 ENCOUNTER — Ambulatory Visit: Payer: Medicaid Other | Admitting: Obstetrics and Gynecology

## 2018-06-28 NOTE — Telephone Encounter (Signed)
Apt was r/s to 06/30/2018. Mirena reserved for this patient.

## 2018-06-30 ENCOUNTER — Ambulatory Visit: Payer: Medicaid Other | Admitting: Obstetrics and Gynecology

## 2020-12-17 NOTE — Telephone Encounter (Signed)
06/30/2018 apt cancelled

## 2024-02-11 ENCOUNTER — Ambulatory Visit: Admitting: Family Medicine

## 2024-02-11 ENCOUNTER — Ambulatory Visit (INDEPENDENT_AMBULATORY_CARE_PROVIDER_SITE_OTHER)
Admission: RE | Admit: 2024-02-11 | Discharge: 2024-02-11 | Disposition: A | Discharge status: Home / Self Care | Source: Ambulatory Visit | Attending: Family Medicine | Admitting: Family Medicine

## 2024-02-11 ENCOUNTER — Encounter: Payer: Self-pay | Admitting: Family Medicine

## 2024-02-11 VITALS — BP 108/70 | HR 65 | Temp 98.0°F | Resp 18 | Ht 65.0 in | Wt 231.8 lb

## 2024-02-11 DIAGNOSIS — Z6838 Body mass index (BMI) 38.0-38.9, adult: Secondary | ICD-10-CM

## 2024-02-11 DIAGNOSIS — F419 Anxiety disorder, unspecified: Secondary | ICD-10-CM

## 2024-02-11 DIAGNOSIS — E66812 Obesity, class 2: Secondary | ICD-10-CM | POA: Diagnosis not present

## 2024-02-11 DIAGNOSIS — E6609 Other obesity due to excess calories: Secondary | ICD-10-CM | POA: Diagnosis not present

## 2024-02-11 DIAGNOSIS — D509 Iron deficiency anemia, unspecified: Secondary | ICD-10-CM

## 2024-02-11 DIAGNOSIS — M25562 Pain in left knee: Secondary | ICD-10-CM

## 2024-02-11 DIAGNOSIS — E559 Vitamin D deficiency, unspecified: Secondary | ICD-10-CM

## 2024-02-11 DIAGNOSIS — F32A Depression, unspecified: Secondary | ICD-10-CM

## 2024-02-11 DIAGNOSIS — Z23 Encounter for immunization: Secondary | ICD-10-CM

## 2024-02-11 MED ORDER — ESCITALOPRAM OXALATE 10 MG PO TABS: 10.0000 mg | ORAL_TABLET | Freq: Every day | ORAL | 1 refills | Status: AC

## 2024-02-11 MED ORDER — WEGOVY 0.25 MG/0.5ML ~~LOC~~ SOAJ: 0.2500 mg | SUBCUTANEOUS | 0 refills | Status: AC

## 2024-02-12 LAB — CBC WITH DIFFERENTIAL/PLATELET
Basophils Absolute: 0 10*3/uL (ref 0.0–0.2)
Basos: 1 %
EOS (ABSOLUTE): 0.1 10*3/uL (ref 0.0–0.4)
Eos: 2 %
Hematocrit: 42.4 % (ref 34.0–46.6)
Hemoglobin: 13.7 g/dL (ref 11.1–15.9)
Immature Grans (Abs): 0 10*3/uL (ref 0.0–0.1)
Immature Granulocytes: 0 %
Lymphocytes Absolute: 1.9 10*3/uL (ref 0.7–3.1)
Lymphs: 42 %
MCH: 29.1 pg (ref 26.6–33.0)
MCHC: 32.3 g/dL (ref 31.5–35.7)
MCV: 90 fL (ref 79–97)
Monocytes Absolute: 0.4 10*3/uL (ref 0.1–0.9)
Monocytes: 9 %
Neutrophils Absolute: 2.2 10*3/uL (ref 1.4–7.0)
Neutrophils: 46 %
Platelets: 167 10*3/uL (ref 150–450)
RBC: 4.7 x10E6/uL (ref 3.77–5.28)
RDW: 12.1 % (ref 11.7–15.4)
WBC: 4.6 10*3/uL (ref 3.4–10.8)

## 2024-02-12 LAB — LIPID PANEL
Chol/HDL Ratio: 4.6 ratio — ABNORMAL HIGH (ref 0.0–4.4)
Cholesterol, Total: 133 mg/dL (ref 100–199)
HDL: 29 mg/dL — ABNORMAL LOW
LDL Chol Calc (NIH): 88 mg/dL (ref 0–99)
Triglycerides: 78 mg/dL (ref 0–149)
VLDL Cholesterol Cal: 16 mg/dL (ref 5–40)

## 2024-02-12 LAB — IRON,TIBC AND FERRITIN PANEL
Ferritin: 129 ng/mL (ref 15–150)
Iron Saturation: 31 % (ref 15–55)
Iron: 88 ug/dL (ref 27–159)
Total Iron Binding Capacity: 287 ug/dL (ref 250–450)
UIBC: 199 ug/dL (ref 131–425)

## 2024-02-12 LAB — COMPREHENSIVE METABOLIC PANEL WITH GFR
ALT: 38 [IU]/L — ABNORMAL HIGH (ref 0–32)
AST: 24 [IU]/L (ref 0–40)
Albumin: 4.4 g/dL (ref 3.9–4.9)
Alkaline Phosphatase: 99 [IU]/L (ref 41–116)
BUN/Creatinine Ratio: 9 (ref 9–23)
BUN: 7 mg/dL (ref 6–20)
Bilirubin Total: 0.7 mg/dL (ref 0.0–1.2)
CO2: 22 mmol/L (ref 20–29)
Calcium: 9.1 mg/dL (ref 8.7–10.2)
Chloride: 103 mmol/L (ref 96–106)
Creatinine, Ser: 0.77 mg/dL (ref 0.57–1.00)
Globulin, Total: 2.8 g/dL (ref 1.5–4.5)
Glucose: 91 mg/dL (ref 70–99)
Potassium: 4.2 mmol/L (ref 3.5–5.2)
Sodium: 140 mmol/L (ref 134–144)
Total Protein: 7.2 g/dL (ref 6.0–8.5)
eGFR: 105 mL/min/{1.73_m2}

## 2024-02-12 LAB — VITAMIN D 25 HYDROXY (VIT D DEFICIENCY, FRACTURES): Vit D, 25-Hydroxy: 6.9 ng/mL — ABNORMAL LOW (ref 30.0–100.0)

## 2024-02-12 LAB — TSH: TSH: 0.908 u[IU]/mL (ref 0.450–4.500)

## 2024-02-13 ENCOUNTER — Ambulatory Visit: Payer: Self-pay | Admitting: Family Medicine

## 2024-02-13 DIAGNOSIS — F419 Anxiety disorder, unspecified: Secondary | ICD-10-CM | POA: Insufficient documentation

## 2024-02-13 DIAGNOSIS — M25562 Pain in left knee: Secondary | ICD-10-CM | POA: Insufficient documentation

## 2024-02-13 DIAGNOSIS — D509 Iron deficiency anemia, unspecified: Secondary | ICD-10-CM | POA: Insufficient documentation

## 2024-02-13 DIAGNOSIS — Z23 Encounter for immunization: Secondary | ICD-10-CM | POA: Insufficient documentation

## 2024-02-13 DIAGNOSIS — E6609 Other obesity due to excess calories: Secondary | ICD-10-CM | POA: Insufficient documentation

## 2024-02-13 MED ORDER — VITAMIN D (ERGOCALCIFEROL) 1.25 MG (50000 UNIT) PO CAPS: 50000.0000 [IU] | ORAL_CAPSULE | ORAL | 0 refills | Status: AC

## 2024-02-13 NOTE — Assessment & Plan Note (Signed)
 Class 2 obesity Discussed Wegovy  for weight loss, benefits, side effects, and contraindications. Emphasized lifestyle changes and potential insurance issues. - Order Wegovy  0.25 mg SQ once weekly for 4 weeks. - Committed to 150 minutes of exercise per week. - Advised dietary changes including meal prep and increased protein intake. - Schedule follow-up in four weeks to assess response to Wegovy  and adjust dosage if necessary.

## 2024-02-13 NOTE — Assessment & Plan Note (Signed)
 Anxiety and depression Symptoms of irritability and mood swings. Discussed restarting Lexapro  due to previous use and potential efficacy. Explained potential side effects and considered genetic testing if ineffective. - Restart Lexapro  at 5 mg for two weeks, then increase to 10 mg. - Consider genetic testing if Lexapro  is ineffective. - Encouraged counseling.

## 2024-02-13 NOTE — Assessment & Plan Note (Signed)
 Iron deficiency anemia Previous intolerance to oral iron. Discussed potential need for iron infusions and side effects of oral iron. - Order ferritin level to assess need for iron infusions. - Consider referral to infusion center if ferritin is low.

## 2024-02-13 NOTE — Assessment & Plan Note (Signed)
 Acute knee pain, likely musculoskeletal in origin.  No signs of fracture or septic joint or neurovascular compromise on exam.  Mild swelling. Exacerbated by certain movements. Discussed potential fluid accumulation and need for further evaluation.  Plan: - Recommend rest, ice, compression, and elevation (RICE) - Activity modification and avoidance of aggravating activities while at work - Recommend use of anti-inflammatory medications such as ibuprofen  or Aleve. - Order knee x-ray - Educated on red flag symptoms (fever, increasing swelling, inability to bear weight) - Consider referral for injection or fluid aspiration if pain persists.

## 2024-02-15 ENCOUNTER — Other Ambulatory Visit (HOSPITAL_COMMUNITY): Payer: Self-pay

## 2024-02-15 ENCOUNTER — Other Ambulatory Visit: Payer: Self-pay

## 2024-02-15 DIAGNOSIS — M25562 Pain in left knee: Secondary | ICD-10-CM

## 2024-02-16 ENCOUNTER — Other Ambulatory Visit (HOSPITAL_COMMUNITY): Payer: Self-pay

## 2024-02-16 ENCOUNTER — Telehealth: Payer: Self-pay | Admitting: Pharmacy Technician

## 2024-02-16 NOTE — Telephone Encounter (Signed)
 Pharmacy Patient Advocate Encounter   Received notification from [employer]base CMM KEY that prior authorization for Wegovy  0.25MG /0.5ML auto-injectors  is required/requested.   Insurance verification completed.   The patient is insured through [employer].   Per test claim: PA required; PA submitted to above mentioned insurance via Latent Key/confirmation #/EOC [id number] Status is pending

## 2024-02-17 NOTE — Telephone Encounter (Signed)
 Pt called requesting status update, has questions about how to get this prior authorization approved. Requesting a call back  Best contact: [phone]

## 2024-02-17 NOTE — Telephone Encounter (Signed)
 Patient aware of denial. Patient aware to notify provider  during her visit what she is doing for diet modifications and exercising.

## 2024-02-17 NOTE — Telephone Encounter (Signed)
 Pharmacy Patient Advocate Encounter  Received notification from [employer] that Prior Authorization for Wegovy  0.25MG /0.5ML auto-injectors  has been DENIED.  Full denial letter will be uploaded to the media tab. See denial reason below.   PA #/Case ID/Reference #: [id number]

## 2024-02-18 ENCOUNTER — Ambulatory Visit: Admitting: Family Medicine

## 2024-03-10 ENCOUNTER — Ambulatory Visit: Admitting: Family Medicine
